# Patient Record
Sex: Male | Born: 1960 | Race: White | Hispanic: No | Marital: Single | Smoking: Former smoker
Health system: Southern US, Community
[De-identification: ages and names within clinical notes are randomized; demographics above are authoritative.]

## PROBLEM LIST (undated history)

## (undated) DIAGNOSIS — G40909 Epilepsy, unspecified, not intractable, without status epilepticus: Secondary | ICD-10-CM

## (undated) HISTORY — DX: Epilepsy, unspecified, not intractable, without status epilepticus: G40.909

## (undated) HISTORY — PX: HYDROCELE EXCISION: SHX482

---

## 2004-02-28 ENCOUNTER — Other Ambulatory Visit: Payer: Self-pay

## 2004-02-28 ENCOUNTER — Inpatient Hospital Stay: Payer: Self-pay | Admitting: Anesthesiology

## 2004-02-29 ENCOUNTER — Other Ambulatory Visit: Payer: Self-pay

## 2004-03-25 ENCOUNTER — Ambulatory Visit: Payer: Self-pay | Admitting: General Surgery

## 2004-04-26 ENCOUNTER — Ambulatory Visit: Payer: Self-pay | Admitting: Internal Medicine

## 2010-01-30 ENCOUNTER — Emergency Department: Payer: Self-pay | Admitting: Internal Medicine

## 2013-05-14 ENCOUNTER — Emergency Department: Payer: Self-pay | Admitting: Emergency Medicine

## 2013-05-14 LAB — CBC WITH DIFFERENTIAL/PLATELET
Basophil #: 0 10*3/uL (ref 0.0–0.1)
Basophil %: 0.2 %
Eosinophil #: 0.3 10*3/uL (ref 0.0–0.7)
Eosinophil %: 5.2 %
HCT: 40.9 % (ref 40.0–52.0)
HGB: 13.9 g/dL (ref 13.0–18.0)
LYMPHS ABS: 0.9 10*3/uL — AB (ref 1.0–3.6)
Lymphocyte %: 15.3 %
MCH: 30.7 pg (ref 26.0–34.0)
MCHC: 33.9 g/dL (ref 32.0–36.0)
MCV: 91 fL (ref 80–100)
Monocyte #: 0.6 x10 3/mm (ref 0.2–1.0)
Monocyte %: 10.1 %
NEUTROS ABS: 4.2 10*3/uL (ref 1.4–6.5)
Neutrophil %: 69.2 %
PLATELETS: 393 10*3/uL (ref 150–440)
RBC: 4.51 10*6/uL (ref 4.40–5.90)
RDW: 15.9 % — ABNORMAL HIGH (ref 11.5–14.5)
WBC: 6 10*3/uL (ref 3.8–10.6)

## 2013-05-14 LAB — BASIC METABOLIC PANEL
Anion Gap: 8 (ref 7–16)
BUN: 15 mg/dL (ref 7–18)
CHLORIDE: 105 mmol/L (ref 98–107)
CO2: 24 mmol/L (ref 21–32)
Calcium, Total: 8.1 mg/dL — ABNORMAL LOW (ref 8.5–10.1)
Creatinine: 0.96 mg/dL (ref 0.60–1.30)
EGFR (African American): >60
EGFR (Non-African Amer.): >60
GLUCOSE: 115 mg/dL — AB (ref 65–99)
OSMOLALITY: 277 (ref 275–301)
Potassium: 4.3 mmol/L (ref 3.5–5.1)
Sodium: 137 mmol/L (ref 136–145)

## 2016-09-09 DIAGNOSIS — I999 Unspecified disorder of circulatory system: Secondary | ICD-10-CM | POA: Insufficient documentation

## 2017-01-02 ENCOUNTER — Encounter: Payer: Self-pay | Admitting: Emergency Medicine

## 2017-01-02 ENCOUNTER — Emergency Department
Admission: EM | Admit: 2017-01-02 | Discharge: 2017-01-02 | Disposition: A | Discharge status: Home / Self Care | Payer: BLUE CROSS/BLUE SHIELD | Attending: Emergency Medicine | Admitting: Emergency Medicine

## 2017-01-02 ENCOUNTER — Emergency Department: Payer: BLUE CROSS/BLUE SHIELD

## 2017-01-02 DIAGNOSIS — M79604 Pain in right leg: Secondary | ICD-10-CM | POA: Diagnosis present

## 2017-01-02 DIAGNOSIS — L03115 Cellulitis of right lower limb: Secondary | ICD-10-CM | POA: Insufficient documentation

## 2017-01-02 DIAGNOSIS — F17210 Nicotine dependence, cigarettes, uncomplicated: Secondary | ICD-10-CM | POA: Insufficient documentation

## 2017-01-02 LAB — CBC WITH DIFFERENTIAL/PLATELET
BASOS PCT: 1 %
Basophils Absolute: 0 10*3/uL (ref 0–0.1)
EOS ABS: 0.3 10*3/uL (ref 0–0.7)
Eosinophils Relative: 5 %
HCT: 42.2 % (ref 40.0–52.0)
Hemoglobin: 14.3 g/dL (ref 13.0–18.0)
LYMPHS ABS: 0.7 10*3/uL — AB (ref 1.0–3.6)
LYMPHS PCT: 12 %
MCH: 29.7 pg (ref 26.0–34.0)
MCHC: 33.7 g/dL (ref 32.0–36.0)
MCV: 88 fL (ref 80.0–100.0)
MONO ABS: 0.6 10*3/uL (ref 0.2–1.0)
Monocytes Relative: 11 %
NEUTROS ABS: 3.8 10*3/uL (ref 1.4–6.5)
NEUTROS PCT: 71 %
Platelets: 279 10*3/uL (ref 150–440)
RBC: 4.8 MIL/uL (ref 4.40–5.90)
RDW: 14.6 % — AB (ref 11.5–14.5)
WBC: 5.4 10*3/uL (ref 3.8–10.6)

## 2017-01-02 LAB — COMPREHENSIVE METABOLIC PANEL
ALBUMIN: 4.1 g/dL (ref 3.5–5.0)
ALT: 34 U/L (ref 17–63)
ANION GAP: 8 (ref 5–15)
AST: 29 U/L (ref 15–41)
Alkaline Phosphatase: 127 U/L — ABNORMAL HIGH (ref 38–126)
BILIRUBIN TOTAL: 0.7 mg/dL (ref 0.3–1.2)
BUN: 14 mg/dL (ref 6–20)
CO2: 24 mmol/L (ref 22–32)
Calcium: 9.2 mg/dL (ref 8.9–10.3)
Chloride: 102 mmol/L (ref 101–111)
Creatinine, Ser: 0.83 mg/dL (ref 0.61–1.24)
GFR calc non Af Amer: >60 mL/min (ref >60)
GLUCOSE: 94 mg/dL (ref 65–99)
POTASSIUM: 4.1 mmol/L (ref 3.5–5.1)
SODIUM: 134 mmol/L — AB (ref 135–145)
TOTAL PROTEIN: 7.1 g/dL (ref 6.5–8.1)

## 2017-01-02 MED ORDER — SULFAMETHOXAZOLE-TRIMETHOPRIM 800-160 MG PO TABS: 1.0000 | ORAL_TABLET | Freq: Two times a day (BID) | ORAL | 0 refills | Status: AC

## 2017-01-02 MED ORDER — VANCOMYCIN HCL IN DEXTROSE 1-5 GM/200ML-% IV SOLN
1000.0000 mg | Freq: Once | INTRAVENOUS | Status: AC
  Administered 2017-01-02: 1000 mg via INTRAVENOUS
  Filled 2017-01-02: qty 200

## 2017-01-02 NOTE — ED Provider Notes (Signed)
[facility] Emergency Department Provider Note  Time seen: 12:53 PM  I have reviewed the triage vital signs and the nursing notes.   HISTORY  Chief Complaint Leg Swelling    HPI David Lynn is a 56 y.o. male who presents to the emergency department for right leg pain swelling and redness. According to the patient for the past several years he has had intermittent swelling and redness of the right leg. He states over the past one week it has gotten worse along with pain. Denies any fever or nausea or vomiting. Patient was sent by his doctor for an ultrasound to rule out DVT. No history of DVT per patient. Denies any chest pain or trouble breathing.  History reviewed. No pertinent past medical history.  There are no active problems to display for this patient.   Past Surgical History:  Procedure Laterality Date  . HYDROCELE EXCISION      Prior to Admission medications   Not on File    Allergies  Allergen Reactions  . Iodine Rash  . Penicillins Rash    No family history on file.  Social History Social History  Substance Use Topics  . Smoking status: Current Every Day Smoker    Packs/day: 1.00    Types: Cigarettes  . Smokeless tobacco: Not on file  . Alcohol use Not on file    Review of Systems Constitutional: Negative for fever. Cardiovascular: Negative for chest pain. Respiratory: Negative for shortness of breath. Gastrointestinal: Negative for abdominal pain Musculoskeletal: Right leg pain, swelling and redness. Neurological: Negative for headache All other ROS negative  ____________________________________________   PHYSICAL EXAM:  VITAL SIGNS: ED Triage Vitals [01/02/17 1115]  Enc Vitals Group     BP (!) 103/59     Pulse Rate (!) 55     Resp 18     Temp 97.8 F (36.6 C)     Temp Source Oral     SpO2 97 %     Weight 264 lb (119.7 kg)     Height 6' (1.829 m)     Head Circumference      Peak Flow      Pain Score       Pain Loc      Pain Edu?      Excl. in [place]?     Constitutional: Alert and oriented. Well appearing and in no distress. Eyes: Normal exam ENT   Head: Normocephalic and atraumatic.   Mouth/Throat: Mucous membranes are moist. Cardiovascular: Normal rate, regular rhythm. No murmur Respiratory: Normal respiratory effort without tachypnea nor retractions. Breath sounds are clear  Gastrointestinal: Soft and nontender. No distention.  Musculoskeletal: Moderate edema in the right lower extremity with erythema midway up the tibia with tenderness to palpation. Exam most consistent with DVT versus cellulitis. Neurologic:  Normal speech and language. No gross focal neurologic deficits Skin:  Skin is warm, dry. Erythema in the right lower extremity as described above. Psychiatric: Mood and affect are normal.   ____________________________________________   RADIOLOGY  Ultrasound negative for DVT  ____________________________________________   INITIAL IMPRESSION / ASSESSMENT AND PLAN / ED COURSE  Pertinent labs & imaging results that were available during my care of the patient were reviewed by me and considered in my medical decision making (see chart for details).  Patient presents the emergency department for right lower extremity pain, swelling and redness. No chest pain or shortness of breath. Differential this time would include cellulitis versus lymphedema vs DVT.  Ultrasound  is negative for DVT. Highly suspect cellulitis. We will check labs, we will begin IV vancomycin infusion. We will await lab results to decide upon disposition and outpatient antibiotics.  Patient's labs are reassuring, white blood cell count is normal. We will place the patient on Bactrim twice daily for the next 10 days and have him follow-up with his primary care doctor in 2-3 days for recheck. The area of cellulitis has been outlined with a skin marker. Discussed return precautions with the  patient.  ____________________________________________   FINAL CLINICAL IMPRESSION(S) / ED DIAGNOSES  Right lower extremity cellulitis    [place] Dark, MD 01/02/17 1436

## 2017-01-02 NOTE — ED Triage Notes (Signed)
R leg edema x several days. Not painful at rest but painful when he stands on it. Sent by clinic for R/O DVT.

## 2017-01-16 ENCOUNTER — Telehealth: Payer: Self-pay | Admitting: Emergency Medicine

## 2017-01-16 NOTE — Telephone Encounter (Signed)
Called patient to ask if he has pcp.  Left message indicating that I have information from [facility], but that it would be better for his pcp to have the information. Left my number.

## 2017-03-06 ENCOUNTER — Encounter (INDEPENDENT_AMBULATORY_CARE_PROVIDER_SITE_OTHER): Payer: Self-pay

## 2017-03-06 ENCOUNTER — Ambulatory Visit (INDEPENDENT_AMBULATORY_CARE_PROVIDER_SITE_OTHER): Payer: BLUE CROSS/BLUE SHIELD

## 2017-03-06 ENCOUNTER — Encounter (INDEPENDENT_AMBULATORY_CARE_PROVIDER_SITE_OTHER): Payer: Self-pay | Admitting: Vascular Surgery

## 2017-03-06 ENCOUNTER — Ambulatory Visit (INDEPENDENT_AMBULATORY_CARE_PROVIDER_SITE_OTHER): Payer: BLUE CROSS/BLUE SHIELD | Admitting: Vascular Surgery

## 2017-03-06 ENCOUNTER — Other Ambulatory Visit (INDEPENDENT_AMBULATORY_CARE_PROVIDER_SITE_OTHER): Payer: Self-pay | Admitting: Vascular Surgery

## 2017-03-06 ENCOUNTER — Other Ambulatory Visit (INDEPENDENT_AMBULATORY_CARE_PROVIDER_SITE_OTHER): Payer: Self-pay | Admitting: *Deleted

## 2017-03-06 DIAGNOSIS — L03119 Cellulitis of unspecified part of limb: Secondary | ICD-10-CM | POA: Diagnosis not present

## 2017-03-06 DIAGNOSIS — I83813 Varicose veins of bilateral lower extremities with pain: Secondary | ICD-10-CM | POA: Diagnosis not present

## 2017-03-06 DIAGNOSIS — I83013 Varicose veins of right lower extremity with ulcer of ankle: Secondary | ICD-10-CM

## 2017-03-06 DIAGNOSIS — L97919 Non-pressure chronic ulcer of unspecified part of right lower leg with unspecified severity: Secondary | ICD-10-CM

## 2017-03-06 DIAGNOSIS — I872 Venous insufficiency (chronic) (peripheral): Secondary | ICD-10-CM | POA: Diagnosis not present

## 2017-03-06 DIAGNOSIS — L97319 Non-pressure chronic ulcer of right ankle with unspecified severity: Secondary | ICD-10-CM | POA: Diagnosis not present

## 2017-03-07 ENCOUNTER — Encounter (INDEPENDENT_AMBULATORY_CARE_PROVIDER_SITE_OTHER): Payer: Self-pay | Admitting: Vascular Surgery

## 2017-03-07 DIAGNOSIS — I872 Venous insufficiency (chronic) (peripheral): Secondary | ICD-10-CM | POA: Insufficient documentation

## 2017-03-07 DIAGNOSIS — I83813 Varicose veins of bilateral lower extremities with pain: Secondary | ICD-10-CM | POA: Insufficient documentation

## 2017-03-07 DIAGNOSIS — L97319 Non-pressure chronic ulcer of right ankle with unspecified severity: Principal | ICD-10-CM

## 2017-03-07 DIAGNOSIS — I83013 Varicose veins of right lower extremity with ulcer of ankle: Secondary | ICD-10-CM | POA: Insufficient documentation

## 2017-03-07 NOTE — Progress Notes (Signed)
MRN : [id number]  David Lynn is a 56 y.o. (Dec 08, 1960) male who presents with chief complaint of  Chief Complaint  Patient presents with  . New Patient (Initial Visit)    Ble art ultrasound  .  History of Present Illness: Patient is seen for evaluation of leg pain and swelling associated with onset right ankle ulceration. The patient first noticed swelling remotely. The swelling is associated with pain and discoloration. The pain and swelling worsens with prolonged dependency and improves with elevation. The pain is unrelated to activity.  The patient notes that in the morning the legs are better but the leg symptoms worsened throughout the course of the day. The patient has also noted a progressive worsening of the discoloration in the ankle and shin area.   The patient notes that an ulcer has developed acutely without specific trauma and since it occurred it has been very slow to heal.  There is a moderate amount of drainage associated with the open area.  The wound is also very painful.  The patient denies claudication symptoms or rest pain symptoms.  The patient denies DJD and LS spine disease.  The patient has not had any past angiography, interventions or vascular surgery.  Elevation makes the leg symptoms better, dependency makes them much worse. The patient denies any recent changes in medications.  The patient has not been wearing graduated compression.  The patient denies a history of DVT or PE. There is no prior history of phlebitis. There is no history of primary lymphedema.  No history of malignancies. No history of trauma or groin or pelvic surgery. There is no history of radiation treatment to the groin or pelvis      Current Meds  Medication Sig  . lamoTRIgine (LAMICTAL) 200 MG tablet Take by mouth.  . mupirocin ointment (BACTROBAN) 2 % Place 1 application into the nose as needed.    Past Medical History:  Diagnosis Date  . Seizure disorder [facility])      Past Surgical History:  Procedure Laterality Date  . HYDROCELE EXCISION      Social History Social History   Tobacco Use  . Smoking status: Current Every Day Smoker    Packs/day: 1.00    Types: Cigarettes  . Smokeless tobacco: Never Used  Substance Use Topics  . Alcohol use: No    Frequency: Never  . Drug use: No    Family History Family History  Problem Relation Age of Onset  . Heart attack Father   No family history of bleeding/clotting disorders, porphyria or autoimmune disease   Allergies  Allergen Reactions  . Iodine Rash  . Penicillins Rash     REVIEW OF SYSTEMS (Negative unless checked)  Constitutional: [] Weight loss  [] Fever  [] Chills Cardiac: [] Chest pain   [] Chest pressure   [] Palpitations   [] Shortness of breath when laying flat   [] Shortness of breath with exertion. Vascular:  [] Pain in legs with walking   [x] Pain in legs with standing  [] History of DVT   [] Phlebitis   [x] Swelling in legs   [x] Varicose veins   [x] Non-healing ulcers Pulmonary:   [] Uses home oxygen   [] Productive cough   [] Hemoptysis   [] Wheeze  [] COPD   [] Asthma Neurologic:  [] Dizziness   [] Seizures   [] History of stroke   [] History of TIA  [] Aphasia   [] Vissual changes   [] Weakness or numbness in arm   [] Weakness or numbness in leg Musculoskeletal:   [] Joint swelling   [] Joint pain   []   Low back pain Hematologic:  [] Easy bruising  [] Easy bleeding   [] Hypercoagulable state   [] Anemic Gastrointestinal:  [] Diarrhea   [] Vomiting  [] Gastroesophageal reflux/heartburn   [] Difficulty swallowing. Genitourinary:  [] Chronic kidney disease   [] Difficult urination  [] Frequent urination   [] Blood in urine Skin:  [] Rashes   [] Ulcers  Psychological:  [] History of anxiety   []  History of major depression.  Physical Examination  Vitals:   03/06/17 1413  BP: 118/68  Pulse: 62  Resp: 17  Weight: 109.3 kg (241 lb)  Height: 6' (1.829 m)   Body mass index is 32.69 kg/m. Gen: WD/WN, NAD Head:  /AT, No temporalis wasting.  Ear/Nose/Throat: Hearing grossly intact, nares w/o erythema or drainage Eyes: PER, EOMI, sclera nonicteric.  Neck: Supple, no large masses.   Pulmonary:  Good air movement, no audible wheezing bilaterally, no use of accessory muscles.  Cardiac: RRR, no JVD Vascular:Large varicosities present extensively greater than 10 mm .  severe venous stasis changes to the legs bilaterally.  4+ soft pitting edema Vessel Right Left  Radial Palpable Palpable  PT Palpable Palpable  DP Palpable Palpable  Gastrointestinal: Non-distended. No guarding/no peritoneal signs.  Musculoskeletal: M/S 5/5 throughout.  No deformity or atrophy.  Neurologic: CN 2-12 intact. Symmetrical.  Speech is fluent. Motor exam as listed above. Psychiatric: Judgment intact, Mood & affect appropriate for pt's clinical situation. Dermatologic: severe rashes with right ulcers noted.  No changes consistent with cellulitis. Lymph : No lichenification or skin changes of chronic lymphedema.  CBC Lab Results  Component Value Date   WBC 5.4 01/02/2017   HGB 14.3 01/02/2017   HCT 42.2 01/02/2017   MCV 88.0 01/02/2017   PLT 279 01/02/2017    BMET    Component Value Date/Time   NA 134 (L) 01/02/2017 1309   NA 137 05/14/2013 1737   K 4.1 01/02/2017 1309   K 4.3 05/14/2013 1737   CL 102 01/02/2017 1309   CL 105 05/14/2013 1737   CO2 24 01/02/2017 1309   CO2 24 05/14/2013 1737   GLUCOSE 94 01/02/2017 1309   GLUCOSE 115 (H) 05/14/2013 1737   BUN 14 01/02/2017 1309   BUN 15 05/14/2013 1737   CREATININE 0.83 01/02/2017 1309   CREATININE 0.96 05/14/2013 1737   CALCIUM 9.2 01/02/2017 1309   CALCIUM 8.1 (L) 05/14/2013 1737   GFRNONAA >60 01/02/2017 1309   GFRNONAA >60 05/14/2013 1737   GFRAA >60 01/02/2017 1309   GFRAA >60 05/14/2013 1737   CrCl cannot be calculated (Patient's most recent lab result is older than the maximum 21 days allowed.).  COAG No results found for: INR,  PROTIME  Radiology No results found.  Assessment/Plan 1. Venous ulcer of ankle, right (HCC) No surgery or intervention at this point in time.    I have had a long discussion with the patient regarding venous insufficiency and why it  causes symptoms, specifically venous ulceration . I have discussed with the patient the chronic skin changes that accompany venous insufficiency and the long term sequela such as infection and recurring  ulceration.  Patient will use a local dressing and firm compression which will be changed daily drainage permitting.  In addition, behavioral modification including several periods of elevation of the lower extremities during the day will be continued. Achieving a position with the ankles at heart level was stressed to the patient  The patient is instructed to begin routine exercise, especially walking on a daily basis  Patient's duplex ultrasound of the venous system  is negative for DVT but reflux is present throughout the deep and the superficial system of the right leg.  The patient will follow up in 6 weeks to reassess the degree of swelling and the control that compression therapy is offering.   The patient can be assessed for graduated compression wraps as well as a Lymph Pump at that time.   A total of 70 minutes was spent with this patient and greater than 50% was spent in counseling and coordination of care with the patient.  Discussion included the treatment options for vascular disease including indications for surgery and intervention.  Also discussed is the appropriate timing of treatment.  In addition medical therapy was discussed.  2. Chronic venous insufficiency See #1  3. Varicose veins of both lower extremities with pain Compression for now    Hortencia Pilar, MD  03/07/2017 9:23 PM

## 2017-03-20 ENCOUNTER — Encounter (INDEPENDENT_AMBULATORY_CARE_PROVIDER_SITE_OTHER): Payer: Self-pay | Admitting: Vascular Surgery

## 2017-03-20 ENCOUNTER — Encounter (INDEPENDENT_AMBULATORY_CARE_PROVIDER_SITE_OTHER): Payer: Self-pay

## 2017-04-16 NOTE — Progress Notes (Signed)
MRN : [id number]  David Lynn is a 57 y.o. (28-Apr-1960) male who presents with chief complaint of No chief complaint on file. Marland Kitchen  History of Present Illness: Patient is seen for follow up evaluation of leg pain and swelling associated with venous ulceration. The patient was recently seen here and started on Unna boot therapy.  The swelling abruptly became much worse bilaterally and is associated with pain and discoloration. The pain and swelling worsens with prolonged dependency and improves with elevation.  The patient notes that in the morning the legs are better but the leg symptoms worsened throughout the course of the day. The patient has also noted a progressive worsening of the discoloration in the ankle and shin area.   The patient notes that an ulcer has developed acutely without specific trauma and since it occurred it has been very slow to heal.  There is a moderate amount of drainage associated with the open area.  The wound is also very painful.  The patient notes that they were not able to tolerate the Unna boot and removed it several days ago.  The patient states that they have been elevating as much as possible. The patient denies any recent changes in medications.  The patient denies a history of DVT or PE. There is no prior history of phlebitis. There is no history of primary lymphedema.  No SOB or increased cough.  No sputum production.  No recent episodes of CHF exacerbation.   No outpatient medications have been marked as taking for the 04/17/17 encounter (Appointment) with Delana Meyer, Dolores Lory, MD.    Past Medical History:  Diagnosis Date  . Seizure disorder [facility])     Past Surgical History:  Procedure Laterality Date  . HYDROCELE EXCISION      Social History Social History   Tobacco Use  . Smoking status: Current Every Day Smoker    Packs/day: 1.00    Types: Cigarettes  . Smokeless tobacco: Never Used  Substance Use Topics  . Alcohol use: No   Frequency: Never  . Drug use: No    Family History Family History  Problem Relation Age of Onset  . Heart attack Father     Allergies  Allergen Reactions  . Iodine Rash  . Penicillins Rash     REVIEW OF SYSTEMS (Negative unless checked)  Constitutional: [] Weight loss  [] Fever  [] Chills Cardiac: [] Chest pain   [] Chest pressure   [] Palpitations   [] Shortness of breath when laying flat   [] Shortness of breath with exertion. Vascular:  [] Pain in legs with walking   [x] Pain in legs at rest  [] History of DVT   [] Phlebitis   [x] Swelling in legs   [x] Varicose veins   [x] Non-healing ulcers Pulmonary:   [] Uses home oxygen   [] Productive cough   [] Hemoptysis   [] Wheeze  [] COPD   [] Asthma Neurologic:  [] Dizziness   [] Seizures   [] History of stroke   [] History of TIA  [] Aphasia   [] Vissual changes   [] Weakness or numbness in arm   [] Weakness or numbness in leg Musculoskeletal:   [] Joint swelling   [] Joint pain   [] Low back pain Hematologic:  [] Easy bruising  [] Easy bleeding   [] Hypercoagulable state   [] Anemic Gastrointestinal:  [] Diarrhea   [] Vomiting  [] Gastroesophageal reflux/heartburn   [] Difficulty swallowing. Genitourinary:  [] Chronic kidney disease   [] Difficult urination  [] Frequent urination   [] Blood in urine Skin:  [x] Rashes   [x] Ulcers  Psychological:  [] History of anxiety   []  History  of major depression.  Physical Examination  There were no vitals filed for this visit. There is no height or weight on file to calculate BMI. Gen: WD/WN, NAD Head: [place]/AT, No temporalis wasting.  Ear/Nose/Throat: Hearing grossly intact, nares w/o erythema or drainage Eyes: PER, EOMI, sclera nonicteric.  Neck: Supple, no large masses.   Pulmonary:  Good air movement, no audible wheezing bilaterally, no use of accessory muscles.  Cardiac: RRR, no JVD Vascular: 2-3+ edema of the right leg with severe venous changes of the right leg.  Venous ulcer noted in the ankle area on the right,  noninfected Vessel Right Left  Radial Palpable Palpable  PT Palpable Palpable  DP Palpable Palpable  Gastrointestinal: Non-distended. No guarding/no peritoneal signs.  Musculoskeletal: M/S 5/5 throughout.  No deformity or atrophy.  Neurologic: CN 2-12 intact. Symmetrical.  Speech is fluent. Motor exam as listed above. Psychiatric: Judgment intact, Mood & affect appropriate for pt's clinical situation. Dermatologic: Venous stasis dermatitis with healed ulcer of the right ankle.  No changes consistent with cellulitis.  Lymph : No lichenification or skin changes of chronic lymphedema.  CBC Lab Results  Component Value Date   WBC 5.4 01/02/2017   HGB 14.3 01/02/2017   HCT 42.2 01/02/2017   MCV 88.0 01/02/2017   PLT 279 01/02/2017    BMET    Component Value Date/Time   NA 134 (L) 01/02/2017 1309   NA 137 05/14/2013 1737   K 4.1 01/02/2017 1309   K 4.3 05/14/2013 1737   CL 102 01/02/2017 1309   CL 105 05/14/2013 1737   CO2 24 01/02/2017 1309   CO2 24 05/14/2013 1737   GLUCOSE 94 01/02/2017 1309   GLUCOSE 115 (H) 05/14/2013 1737   BUN 14 01/02/2017 1309   BUN 15 05/14/2013 1737   CREATININE 0.83 01/02/2017 1309   CREATININE 0.96 05/14/2013 1737   CALCIUM 9.2 01/02/2017 1309   CALCIUM 8.1 (L) 05/14/2013 1737   GFRNONAA >60 01/02/2017 1309   GFRNONAA >60 05/14/2013 1737   GFRAA >60 01/02/2017 1309   GFRAA >60 05/14/2013 1737   CrCl cannot be calculated (Patient's most recent lab result is older than the maximum 21 days allowed.).  COAG No results found for: INR, PROTIME  Radiology No results found.  Assessment/Plan 1. Chronic venous insufficiency No surgery or intervention at this point in time.    I have had a long discussion with the patient regarding venous insufficiency and why it  causes symptoms. I have discussed with the patient the chronic skin changes that accompany venous insufficiency and the long term sequela such as infection and ulceration.  Patient will  begin wearing graduated compression stockings class 1 (20-30 mmHg) or compression wraps on a daily basis a prescription was given. The patient will put the stockings on first thing in the morning and removing them in the evening. The patient is instructed specifically not to sleep in the stockings.    In addition, behavioral modification including several periods of elevation of the lower extremities during the day will be continued. I have demonstrated that proper elevation is a position with the ankles at heart level.  The patient is instructed to begin routine exercise, especially walking on a daily basis  Patient's duplex ultrasound of the venous system is negative for DVT but shows reflux in the deep and superficial system.  I have discussed laser ablation of the GSV but he does not wich to pursue this at this time.  The patient will follow up  in 6 months to reassess the degree of swelling and the control that graduated compression stockings or compression wraps  is offering.   The patient can be assessed for a Lymph Pump at that time  2. Varicose veins of both lower extremities with pain See #1  3. Venous ulcer of ankle, right [facility] Now healed    Hortencia Pilar, MD  04/16/2017 12:57 PM

## 2017-04-17 ENCOUNTER — Encounter (INDEPENDENT_AMBULATORY_CARE_PROVIDER_SITE_OTHER): Payer: Self-pay | Admitting: Vascular Surgery

## 2017-04-17 ENCOUNTER — Ambulatory Visit (INDEPENDENT_AMBULATORY_CARE_PROVIDER_SITE_OTHER): Payer: BLUE CROSS/BLUE SHIELD | Admitting: Vascular Surgery

## 2017-04-17 VITALS — BP 106/61 | HR 58 | Resp 17 | Wt 238.0 lb

## 2017-04-17 DIAGNOSIS — I872 Venous insufficiency (chronic) (peripheral): Secondary | ICD-10-CM

## 2017-04-17 DIAGNOSIS — I83013 Varicose veins of right lower extremity with ulcer of ankle: Secondary | ICD-10-CM

## 2017-04-17 DIAGNOSIS — L97319 Non-pressure chronic ulcer of right ankle with unspecified severity: Secondary | ICD-10-CM | POA: Diagnosis not present

## 2017-04-17 DIAGNOSIS — I83813 Varicose veins of bilateral lower extremities with pain: Secondary | ICD-10-CM

## 2017-07-26 ENCOUNTER — Telehealth: Payer: Self-pay | Admitting: Family Medicine

## 2017-07-26 NOTE — Telephone Encounter (Signed)
I may have discussed this previously though at this time I am no longer taking any more new patients.

## 2017-07-26 NOTE — Telephone Encounter (Signed)
Does this sound familiar to you?

## 2017-07-26 NOTE — Telephone Encounter (Signed)
Copied from [place] [id number]. Topic: Appointment Scheduling - New Patient >> Jul 26, 2017 11:09 AM Oliver Pila B wrote: Pt's mother called and states on her last visit Dr. Caryl Bis said she will take her son as a new pt, per one note this crm shouldn't be sent but contact pt or pt's [phone]) w/ decision

## 2017-07-27 NOTE — Telephone Encounter (Signed)
Patient mother notified that Dr. Caryl Bis will not be accepting new patients at this time.

## 2017-07-27 NOTE — Telephone Encounter (Signed)
Called and left voicemail for patient mother to call the office.

## 2017-10-16 ENCOUNTER — Ambulatory Visit (INDEPENDENT_AMBULATORY_CARE_PROVIDER_SITE_OTHER): Payer: BLUE CROSS/BLUE SHIELD | Admitting: Vascular Surgery

## 2017-10-23 ENCOUNTER — Ambulatory Visit (INDEPENDENT_AMBULATORY_CARE_PROVIDER_SITE_OTHER): Payer: BLUE CROSS/BLUE SHIELD | Admitting: Vascular Surgery

## 2017-10-23 ENCOUNTER — Encounter (INDEPENDENT_AMBULATORY_CARE_PROVIDER_SITE_OTHER): Payer: Self-pay | Admitting: Vascular Surgery

## 2017-10-23 VITALS — BP 103/63 | HR 51 | Resp 15 | Ht 70.0 in | Wt 244.0 lb

## 2017-10-23 DIAGNOSIS — I83813 Varicose veins of bilateral lower extremities with pain: Secondary | ICD-10-CM | POA: Diagnosis not present

## 2017-10-23 DIAGNOSIS — I872 Venous insufficiency (chronic) (peripheral): Secondary | ICD-10-CM

## 2017-10-23 NOTE — Progress Notes (Signed)
MRN : [id number]  David Lynn is a 57 y.o. (04/23/1960) male who presents with chief complaint of  Chief Complaint  Patient presents with  . Follow-up    6 month no studies  .  History of Present Illness:The patient returns to the office for followup evaluation regarding venous changes associated with leg swelling.  The swelling has improved quite a bit and the pain associated with swelling has decreased substantially with conservative measures. There have not been any interval development of a ulcerations or wounds.  Since the previous visit the patient has been wearing graduated compression stockings and has noted little significant improvement in the lymphedema. The patient has been using compression routinely morning until night.  The patient also states elevation during the day and exercise is being done too.     No outpatient medications have been marked as taking for the 10/23/17 encounter (Office Visit) with Delana Meyer, Dolores Lory, MD.    Past Medical History:  Diagnosis Date  . Seizure disorder [facility])     Past Surgical History:  Procedure Laterality Date  . HYDROCELE EXCISION      Social History Social History   Tobacco Use  . Smoking status: Current Every Day Smoker    Packs/day: 1.00    Types: Cigarettes  . Smokeless tobacco: Never Used  Substance Use Topics  . Alcohol use: No    Frequency: Never  . Drug use: No    Family History Family History  Problem Relation Age of Onset  . Heart attack Father     Allergies  Allergen Reactions  . Iodine Rash  . Penicillins Rash     REVIEW OF SYSTEMS (Negative unless checked)  Constitutional: [] Weight loss  [] Fever  [] Chills Cardiac: [] Chest pain   [] Chest pressure   [] Palpitations   [] Shortness of breath when laying flat   [] Shortness of breath with exertion. Vascular:  [] Pain in legs with walking   [x] Pain in legs dependency [] History of DVT   [] Phlebitis   [x] Swelling in legs   [x] Varicose veins    [] Non-healing ulcers Pulmonary:   [] Uses home oxygen   [] Productive cough   [] Hemoptysis   [] Wheeze  [] COPD   [] Asthma Neurologic:  [] Dizziness   [] Seizures   [] History of stroke   [] History of TIA  [] Aphasia   [] Vissual changes   [] Weakness or numbness in arm   [] Weakness or numbness in leg Musculoskeletal:   [] Joint swelling   [] Joint pain   [] Low back pain Hematologic:  [] Easy bruising  [] Easy bleeding   [] Hypercoagulable state   [] Anemic Gastrointestinal:  [] Diarrhea   [] Vomiting  [] Gastroesophageal reflux/heartburn   [] Difficulty swallowing. Genitourinary:  [] Chronic kidney disease   [] Difficult urination  [] Frequent urination   [] Blood in urine Skin:  [] Rashes   [] Ulcers  Psychological:  [] History of anxiety   []  History of major depression.  Physical Examination  Vitals:   10/23/17 1051  BP: 103/63  Pulse: (!) 51  Resp: 15  Weight: 244 lb (110.7 kg)  Height: 5\' 10"  (1.778 m)   Body mass index is 35.01 kg/m. Gen: WD/WN, NAD Head: [place]/AT, No temporalis wasting.  Ear/Nose/Throat: Hearing grossly intact, nares w/o erythema or drainage Eyes: PER, EOMI, sclera nonicteric.  Neck: Supple, no large masses.   Pulmonary:  Good air movement, no audible wheezing bilaterally, no use of accessory muscles.  Cardiac: RRR, no JVD Vascular: scattered varicosities present bilaterally.  Mild venous stasis changes to the legs bilaterally.  2+ soft pitting edema Vessel  Right Left  Radial Palpable Palpable  PT Palpable Palpable  DP Palpable Palpable  Gastrointestinal: Non-distended. No guarding/no peritoneal signs.  Musculoskeletal: M/S 5/5 throughout.  No deformity or atrophy.  Neurologic: CN 2-12 intact. Symmetrical.  Speech is fluent. Motor exam as listed above. Psychiatric: Judgment intact, Mood & affect appropriate for pt's clinical situation. Dermatologic: venous rashes no ulcers noted.  No changes consistent with cellulitis. Lymph : No lichenification or skin changes of chronic  lymphedema.  CBC Lab Results  Component Value Date   WBC 5.4 01/02/2017   HGB 14.3 01/02/2017   HCT 42.2 01/02/2017   MCV 88.0 01/02/2017   PLT 279 01/02/2017    BMET    Component Value Date/Time   NA 134 (L) 01/02/2017 1309   NA 137 05/14/2013 1737   K 4.1 01/02/2017 1309   K 4.3 05/14/2013 1737   CL 102 01/02/2017 1309   CL 105 05/14/2013 1737   CO2 24 01/02/2017 1309   CO2 24 05/14/2013 1737   GLUCOSE 94 01/02/2017 1309   GLUCOSE 115 (H) 05/14/2013 1737   BUN 14 01/02/2017 1309   BUN 15 05/14/2013 1737   CREATININE 0.83 01/02/2017 1309   CREATININE 0.96 05/14/2013 1737   CALCIUM 9.2 01/02/2017 1309   CALCIUM 8.1 (L) 05/14/2013 1737   GFRNONAA >60 01/02/2017 1309   GFRNONAA >60 05/14/2013 1737   GFRAA >60 01/02/2017 1309   GFRAA >60 05/14/2013 1737   CrCl cannot be calculated (Patient's most recent lab result is older than the maximum 21 days allowed.).  COAG No results found for: INR, PROTIME  Radiology No results found.    Assessment/Plan 1. Varicose veins of both lower extremities with pain  No surgery or intervention at this point in time.    I have reviewed my previous discussion with the patient regarding swelling and why it  causes symptoms.  The patient is doing well with compression and will continue wearing graduated compression stockings class 1 (20-30 mmHg) on a daily basis a prescription was given. The patient will  continue wearing the stockings first thing in the morning and removing them in the evening. The patient is instructed specifically not to sleep in the stockings.    In addition, behavioral modification including elevation during the day and exercise will be continued.    Patient should follow-up on an annual basis   2. Chronic venous insufficiency  No surgery or intervention at this point in time.    I have reviewed my previous discussion with the patient regarding swelling and why it  causes symptoms.  The patient is doing well  with compression and will continue wearing graduated compression stockings class 1 (20-30 mmHg) on a daily basis a prescription was given. The patient will  continue wearing the stockings first thing in the morning and removing them in the evening. The patient is instructed specifically not to sleep in the stockings.    In addition, behavioral modification including elevation during the day and exercise will be continued.    Patient should follow-up on an annual basis     Hortencia Pilar, MD  10/23/2017 11:11 AM

## 2017-10-25 ENCOUNTER — Encounter (INDEPENDENT_AMBULATORY_CARE_PROVIDER_SITE_OTHER): Payer: Self-pay | Admitting: Vascular Surgery

## 2017-11-02 ENCOUNTER — Telehealth (INDEPENDENT_AMBULATORY_CARE_PROVIDER_SITE_OTHER): Payer: Self-pay

## 2017-11-02 NOTE — Telephone Encounter (Signed)
Patient's Mom called and stated that he is ready to move forward with you getting the Lymph pump ordered for his legs. She states that you informed her to call back once they had decided to move forward and checked on their insurance.

## 2017-11-07 ENCOUNTER — Telehealth (INDEPENDENT_AMBULATORY_CARE_PROVIDER_SITE_OTHER): Payer: Self-pay | Admitting: Vascular Surgery

## 2017-11-07 NOTE — Telephone Encounter (Signed)
Patient said they are still waiting to her about the pump and would like to know if it has been approved by [employer].

## 2017-11-20 ENCOUNTER — Ambulatory Visit (INDEPENDENT_AMBULATORY_CARE_PROVIDER_SITE_OTHER): Payer: BLUE CROSS/BLUE SHIELD | Admitting: Vascular Surgery

## 2017-11-20 ENCOUNTER — Encounter (INDEPENDENT_AMBULATORY_CARE_PROVIDER_SITE_OTHER): Payer: Self-pay | Admitting: Vascular Surgery

## 2017-11-20 VITALS — BP 109/63 | HR 68 | Resp 17 | Ht 72.0 in | Wt 240.0 lb

## 2017-11-20 DIAGNOSIS — L97319 Non-pressure chronic ulcer of right ankle with unspecified severity: Secondary | ICD-10-CM

## 2017-11-20 DIAGNOSIS — I872 Venous insufficiency (chronic) (peripheral): Secondary | ICD-10-CM | POA: Diagnosis not present

## 2017-11-20 DIAGNOSIS — I83013 Varicose veins of right lower extremity with ulcer of ankle: Secondary | ICD-10-CM | POA: Diagnosis not present

## 2017-11-20 DIAGNOSIS — I890 Lymphedema, not elsewhere classified: Secondary | ICD-10-CM

## 2017-11-20 NOTE — Progress Notes (Signed)
MRN : [id number]  David Lynn is a 57 y.o. ([birth year]) male who presents with chief complaint of No chief complaint on file. Marland Kitchen  History of Present Illness:  The patient returns to the office for followup evaluation regarding venous changes associated with leg swelling.  The swelling has improved quite a bit and the pain associated with swelling has decreased substantially with conservative measures. There have not been any interval development of a ulcerations or wounds.  Since the previous visit the patient has been wearing graduated compression stockings and has noted little significant improvement in the lymphedema. The patient has been using compression routinely morning until night.  The patient also states elevation during the day and exercise is being done too.   No outpatient medications have been marked as taking for the 11/20/17 encounter (Appointment) with Delana Meyer, Dolores Lory, MD.    Past Medical History:  Diagnosis Date  . Seizure disorder [facility])     Past Surgical History:  Procedure Laterality Date  . HYDROCELE EXCISION      Social History Social History   Tobacco Use  . Smoking status: Current Every Day Smoker    Packs/day: 1.00    Types: Cigarettes  . Smokeless tobacco: Never Used  Substance Use Topics  . Alcohol use: No    Frequency: Never  . Drug use: No    Family History Family History  Problem Relation Age of Onset  . Heart attack Father     Allergies  Allergen Reactions  . Iodine Rash  . Penicillins Rash     REVIEW OF SYSTEMS (Negative unless checked)  Constitutional: [] Weight loss  [] Fever  [] Chills Cardiac: [] Chest pain   [] Chest pressure   [] Palpitations   [] Shortness of breath when laying flat   [] Shortness of breath with exertion. Vascular:  [] Pain in legs with walking   [] Pain in legs at rest  [] History of DVT   [] Phlebitis   [x] Swelling in legs   [] Varicose veins   [] Non-healing ulcers Pulmonary:   [] Uses home oxygen    [] Productive cough   [] Hemoptysis   [] Wheeze  [] COPD   [] Asthma Neurologic:  [] Dizziness   [] Seizures   [] History of stroke   [] History of TIA  [] Aphasia   [] Vissual changes   [] Weakness or numbness in arm   [] Weakness or numbness in leg Musculoskeletal:   [] Joint swelling   [] Joint pain   [] Low back pain Hematologic:  [] Easy bruising  [] Easy bleeding   [] Hypercoagulable state   [] Anemic Gastrointestinal:  [] Diarrhea   [] Vomiting  [] Gastroesophageal reflux/heartburn   [] Difficulty swallowing. Genitourinary:  [] Chronic kidney disease   [] Difficult urination  [] Frequent urination   [] Blood in urine Skin:  [] Rashes   [] Ulcers  Psychological:  [] History of anxiety   []  History of major depression.  Physical Examination  There were no vitals filed for this visit. There is no height or weight on file to calculate BMI. Gen: WD/WN, NAD Head: [place]/AT, No temporalis wasting.  Ear/Nose/Throat: Hearing grossly intact, nares w/o erythema or drainage Eyes: PER, EOMI, sclera nonicteric.  Neck: Supple, no large masses.   Pulmonary:  Good air movement, no audible wheezing bilaterally, no use of accessory muscles.  Cardiac: RRR, no JVD Vascular: scattered varicosities present bilaterally.  Mild venous stasis changes to the legs bilaterally.  3+ soft pitting edema Vessel Right Left  Radial Palpable Palpable  PT Palpable Palpable  DP Palpable Palpable  Gastrointestinal: Non-distended. No guarding/no peritoneal signs.  Musculoskeletal: M/S 5/5 throughout.  No  deformity or atrophy.  Neurologic: CN 2-12 intact. Symmetrical.  Speech is fluent. Motor exam as listed above. Psychiatric: Judgment intact, Mood & affect appropriate for pt's clinical situation. Dermatologic: venous rashes no ulcers noted.  No changes consistent with cellulitis. Lymph : No lichenification or skin changes of chronic lymphedema.  CBC Lab Results  Component Value Date   WBC 5.4 01/02/2017   HGB 14.3 01/02/2017   HCT 42.2 01/02/2017     MCV 88.0 01/02/2017   PLT 279 01/02/2017    BMET    Component Value Date/Time   NA 134 (L) 01/02/2017 1309   NA 137 05/14/2013 1737   K 4.1 01/02/2017 1309   K 4.3 05/14/2013 1737   CL 102 01/02/2017 1309   CL 105 05/14/2013 1737   CO2 24 01/02/2017 1309   CO2 24 05/14/2013 1737   GLUCOSE 94 01/02/2017 1309   GLUCOSE 115 (H) 05/14/2013 1737   BUN 14 01/02/2017 1309   BUN 15 05/14/2013 1737   CREATININE 0.83 01/02/2017 1309   CREATININE 0.96 05/14/2013 1737   CALCIUM 9.2 01/02/2017 1309   CALCIUM 8.1 (L) 05/14/2013 1737   GFRNONAA >60 01/02/2017 1309   GFRNONAA >60 05/14/2013 1737   GFRAA >60 01/02/2017 1309   GFRAA >60 05/14/2013 1737   CrCl cannot be calculated (Patient's most recent lab result is older than the maximum 21 days allowed.).  COAG No results found for: INR, PROTIME  Radiology No results found.  Assessment/Plan 1. Chronic venous insufficiency Recommend:  No surgery or intervention at this point in time.    I have reviewed my previous discussion with the patient regarding swelling and why it causes symptoms.  Patient will continue wearing graduated compression stockings class 1 (20-30 mmHg) on a daily basis. The patient will  beginning wearing the stockings first thing in the morning and removing them in the evening. The patient is instructed specifically not to sleep in the stockings.    In addition, behavioral modification including several periods of elevation of the lower extremities during the day will be continued.  This was reviewed with the patient during the initial visit.  The patient will also continue routine exercise, especially walking on a daily basis as was discussed during the initial visit.    Despite conservative treatments including graduated compression therapy class 1 and behavioral modification including exercise and elevation the patient  has not obtained adequate control of the lymphedema.  The patient still has stage 3  lymphedema and therefore, I believe that a lymph pump should be added to improve the control of the patient's lymphedema.  Additionally, a lymph pump is warranted because it will reduce the risk of cellulitis and ulceration in the future.  Patient should follow-up in six months    2. Lymphedema Recommend:  No surgery or intervention at this point in time.    I have reviewed my previous discussion with the patient regarding swelling and why it causes symptoms.  Patient will continue wearing graduated compression stockings class 1 (20-30 mmHg) on a daily basis. The patient will  beginning wearing the stockings first thing in the morning and removing them in the evening. The patient is instructed specifically not to sleep in the stockings.    In addition, behavioral modification including several periods of elevation of the lower extremities during the day will be continued.  This was reviewed with the patient during the initial visit.  The patient will also continue routine exercise, especially walking on a daily basis  as was discussed during the initial visit.    Despite conservative treatments including graduated compression therapy class 1 and behavioral modification including exercise and elevation the patient  has not obtained adequate control of the lymphedema.  The patient still has stage 3 lymphedema and therefore, I believe that a lymph pump should be added to improve the control of the patient's lymphedema.  Additionally, a lymph pump is warranted because it will reduce the risk of cellulitis and ulceration in the future.  Patient should follow-up in six months    3. Venous ulcer of ankle, right [facility]) Now healed   Hortencia Pilar, MD  11/20/2017 8:35 AM

## 2017-12-14 ENCOUNTER — Telehealth (INDEPENDENT_AMBULATORY_CARE_PROVIDER_SITE_OTHER): Payer: Self-pay

## 2017-12-14 NOTE — Telephone Encounter (Signed)
Patient's mother came in on 12/13/17 very upset because [employer] had contacted them regarding the patient's lymph pump and it will cost $2000. She kept saying that Dr. Delana Meyer told her it would be free, I attempted to let her know that the cost of the pump is not up to the provider but the patient's insurance company as to how much or little his co-pay will be. I then contacted Matt with [employer] and he explained that when they went out, they did go over all of the details of the patients insurance with him and his mother with all up front costs as well. I then spoke with Dr. Delana Meyer and let him know about all of this and per Dr. Delana Meyer he said he let them know that some people do not have a co-pay depending on insurance.

## 2017-12-29 ENCOUNTER — Telehealth (INDEPENDENT_AMBULATORY_CARE_PROVIDER_SITE_OTHER): Payer: Self-pay

## 2017-12-29 NOTE — Telephone Encounter (Signed)
Patient's mother called wanting to cancel his order through [employer] for a lymphedema pump and go through someone else. The mother stated that [employer] would not order a $1500 pump for them to purchase. I will send the information to [employer] for them to attempt to get the patient a pump.

## 2018-01-01 ENCOUNTER — Encounter: Payer: Self-pay | Admitting: Student

## 2018-01-02 ENCOUNTER — Ambulatory Visit: Payer: BLUE CROSS/BLUE SHIELD | Admitting: Anesthesiology

## 2018-01-02 ENCOUNTER — Encounter: Payer: Self-pay | Admitting: *Deleted

## 2018-01-02 ENCOUNTER — Encounter
Admission: RE | Disposition: A | Discharge status: Home / Self Care | Payer: Self-pay | Source: Ambulatory Visit | Attending: Internal Medicine

## 2018-01-02 ENCOUNTER — Ambulatory Visit
Admission: RE | Admit: 2018-01-02 | Discharge: 2018-01-02 | Disposition: A | Discharge status: Home / Self Care | Payer: BLUE CROSS/BLUE SHIELD | Source: Ambulatory Visit | Attending: Internal Medicine | Admitting: Internal Medicine

## 2018-01-02 DIAGNOSIS — I739 Peripheral vascular disease, unspecified: Secondary | ICD-10-CM | POA: Diagnosis not present

## 2018-01-02 DIAGNOSIS — Z79899 Other long term (current) drug therapy: Secondary | ICD-10-CM | POA: Diagnosis not present

## 2018-01-02 DIAGNOSIS — Z880 Allergy status to penicillin: Secondary | ICD-10-CM | POA: Insufficient documentation

## 2018-01-02 DIAGNOSIS — Z1211 Encounter for screening for malignant neoplasm of colon: Secondary | ICD-10-CM | POA: Insufficient documentation

## 2018-01-02 DIAGNOSIS — Z888 Allergy status to other drugs, medicaments and biological substances status: Secondary | ICD-10-CM | POA: Diagnosis not present

## 2018-01-02 HISTORY — DX: Epilepsy, unspecified, not intractable, without status epilepticus: G40.909

## 2018-01-02 HISTORY — PX: COLONOSCOPY WITH PROPOFOL: SHX5780

## 2018-01-02 SURGERY — COLONOSCOPY WITH PROPOFOL
Anesthesia: General

## 2018-01-02 MED ORDER — SODIUM CHLORIDE 0.9 % IV SOLN
INTRAVENOUS | Status: DC
  Administered 2018-01-02: 13:00:00 via INTRAVENOUS

## 2018-01-02 MED ORDER — PROPOFOL 500 MG/50ML IV EMUL
INTRAVENOUS | Status: AC
  Filled 2018-01-02: qty 50

## 2018-01-02 MED ORDER — PROPOFOL 500 MG/50ML IV EMUL
INTRAVENOUS | Status: DC | PRN
  Administered 2018-01-02: 175 ug/kg/min via INTRAVENOUS

## 2018-01-02 MED ORDER — LIDOCAINE HCL (CARDIAC) PF 100 MG/5ML IV SOSY
PREFILLED_SYRINGE | INTRAVENOUS | Status: DC | PRN
  Administered 2018-01-02: 50 mg via INTRAVENOUS

## 2018-01-02 MED ORDER — PROPOFOL 10 MG/ML IV BOLUS
INTRAVENOUS | Status: DC | PRN
  Administered 2018-01-02: 30 mg via INTRAVENOUS
  Administered 2018-01-02: 60 mg via INTRAVENOUS
  Administered 2018-01-02: 50 mg via INTRAVENOUS
  Administered 2018-01-02: 40 mg via INTRAVENOUS

## 2018-01-02 NOTE — Op Note (Addendum)
[facility] Patient Name: David Lynn Procedure Date: 01/02/2018 1:10 PM MRN: [id number] Account #: [id number] Date of Birth: 05/16/1960 Admit Type: Outpatient Age: 57 Room: [facility] ENDO ROOM 2 Gender: Male Note Status: Finalized Procedure:            Colonoscopy Indications:          Screening for colorectal malignant neoplasm Providers:            Benay Pike. Feige Lowdermilk MD, MD Medicines:            Propofol per Anesthesia Complications:        No immediate complications. Procedure:            Pre-Anesthesia Assessment:                       - The risks and benefits of the procedure and the                        sedation options and risks were discussed with the                        patient. All questions were answered and informed                        consent was obtained.                       - Patient identification and proposed procedure were                        verified prior to the procedure by the nurse. The                        procedure was verified in the procedure room.                       - ASA Grade Assessment: II - A patient with mild                        systemic disease.                       - After reviewing the risks and benefits, the patient                        was deemed in satisfactory condition to undergo the                        procedure.                       After obtaining informed consent, the colonoscope was                        passed under direct vision. Throughout the procedure,                        the patient's blood pressure, pulse, and oxygen                        saturations were monitored continuously. The  Colonoscope was introduced through the anus and                        advanced to the the cecum, identified by appendiceal                        orifice and ileocecal valve. The patient tolerated the                        procedure well. The colonoscopy was  somewhat difficult                        due to significant looping. Successful completion of                        the procedure was aided by applying abdominal pressure.                        The quality of the bowel preparation was good. The                        ileocecal valve, appendiceal orifice, and rectum were                        photographed. The entire colon was examined. Findings:      The perianal and digital rectal examinations were normal. Pertinent       negatives include normal sphincter tone and no palpable rectal lesions.      The colon (entire examined portion) appeared normal.      The exam was otherwise without abnormality on direct and retroflexion       views. Impression:           - The entire examined colon is normal.                       - The examination was otherwise normal on direct and                        retroflexion views.                       - No specimens collected. Recommendation:       - Patient has a contact number available for                        emergencies. The signs and symptoms of potential                        delayed complications were discussed with the patient.                        Return to normal activities tomorrow. Written discharge                        instructions were provided to the patient.                       - Resume previous diet.                       -  Continue present medications.                       - Repeat colonoscopy in 10 years for screening purposes.                       - The findings and recommendations were discussed with                        the patient and their family.                       - Return to GI office PRN. Procedure Code(s):    --- Professional ---                       L3903, Colorectal cancer screening; colonoscopy on                        individual not meeting criteria for high risk Diagnosis Code(s):    --- Professional ---                       Z12.11, Encounter for  screening for malignant neoplasm                        of colon CPT copyright 2017 American Medical Association. All rights reserved. The codes documented in this report are preliminary and upon coder review may  be revised to meet current compliance requirements. Efrain Sella MD, MD 01/02/2018 1:39:57 PM This report has been signed electronically. Number of Addenda: 0 Note Initiated On: 01/02/2018 1:10 PM Scope Withdrawal Time: 0 hours 10 minutes 53 seconds  Total Procedure Duration: 0 hours 18 minutes 36 seconds       [facility]

## 2018-01-02 NOTE — H&P (Signed)
Outpatient short stay form Pre-procedure 01/02/2018 1:03 PM Teodoro K. Alice Reichert, M.D.  Primary Physician: [place] Pink, M.D.  Reason for visit: Colon cancer screening  History of present illness:  Patient presents for colonoscopy for colon cancer screening. The patient denies complaints of abdominal pain, significant change in bowel habits, or rectal bleeding.     Current Facility-Administered Medications:  .  0.9 %  sodium chloride infusion, , Intravenous, Continuous, Ivins, Benay Pike, MD, Last Rate: 20 mL/hr at 01/02/18 1250  Medications Prior to Admission  Medication Sig Dispense Refill Last Dose  . lamoTRIgine (LAMICTAL) 200 MG tablet Take by mouth.   01/02/2018 at Unknown time  . mupirocin ointment (BACTROBAN) 2 % Place 1 application into the nose as needed.   Not Taking at Unknown time     Allergies  Allergen Reactions  . Iodine Rash  . Penicillins Rash     Past Medical History:  Diagnosis Date  . Epilepsy ([place])   . Seizure disorder ([place])     Review of systems:  Otherwise negative.    Physical Exam  Gen: Alert, oriented. Appears stated age.  HEENT: [place]/AT. PERRLA. Lungs: CTA, no wheezes. CV: RR nl S1, S2. Abd: soft, benign, no masses. BS+ Ext: No edema. Pulses 2+    Planned procedures: Proceed with colonoscopy. The patient understands the nature of the planned procedure, indications, risks, alternatives and potential complications including but not limited to bleeding, infection, perforation, damage to internal organs and possible oversedation/side effects from anesthesia. The patient agrees and gives consent to proceed.  Please refer to procedure notes for findings, recommendations and patient disposition/instructions.     Teodoro K. Alice Reichert, M.D. Gastroenterology 01/02/2018  1:03 PM

## 2018-01-02 NOTE — Interval H&P Note (Signed)
History and Physical Interval Note:  01/02/2018 1:05 PM  David Lynn  has presented today for surgery, with the diagnosis of COLON CANCER SCREENING  The various methods of treatment have been discussed with the patient and family. After consideration of risks, benefits and other options for treatment, the patient has consented to  Procedure(s): COLONOSCOPY WITH PROPOFOL (N/A) as a surgical intervention .  The patient's history has been reviewed, patient examined, no change in status, stable for surgery.  I have reviewed the patient's chart and labs.  Questions were answered to the patient's satisfaction.     [place], [place]

## 2018-01-02 NOTE — Anesthesia Post-op Follow-up Note (Signed)
Anesthesia QCDR form completed.        

## 2018-01-02 NOTE — Anesthesia Preprocedure Evaluation (Addendum)
Anesthesia Evaluation  Patient identified by MRN, date of birth, ID band Patient awake    Reviewed: Allergy & Precautions, H&P , NPO status , Patient's Chart, lab work & pertinent test results  Airway Mallampati: II  TM Distance: >3 FB Neck ROM: full   Comment: beard Dental  (+) Teeth Intact, Poor Dentition   Pulmonary neg COPD, Current Smoker,     + decreased breath sounds      Cardiovascular Exercise Tolerance: Good (-) angina+ Peripheral Vascular Disease  (-) Past MI, (-) Cardiac Stents, (-) CABG and (-) CHF  Rhythm:regular Rate:Normal     Neuro/Psych Seizures -,  negative psych ROS   GI/Hepatic negative GI ROS, Neg liver ROS,   Endo/Other  negative endocrine ROS  Renal/GU negative Renal ROS  negative genitourinary   Musculoskeletal   Abdominal   Peds  Hematology negative hematology ROS (+)   Anesthesia Other Findings Past Medical History: No date: Epilepsy ([place]) No date: Seizure disorder ([place])  Past Surgical History: No date: HYDROCELE EXCISION  BMI    Body Mass Index:  32.82 kg/m      Reproductive/Obstetrics negative OB ROS                            Anesthesia Physical Anesthesia Plan  ASA: III  Anesthesia Plan: General   Post-op Pain Management:    Induction:   PONV Risk Score and Plan: Propofol infusion and TIVA  Airway Management Planned: Natural Airway and Nasal Cannula  Additional Equipment:   Intra-op Plan:   Post-operative Plan:   Informed Consent: I have reviewed the patients History and Physical, chart, labs and discussed the procedure including the risks, benefits and alternatives for the proposed anesthesia with the patient or authorized representative who has indicated his/her understanding and acceptance.   Dental Advisory Given  Plan Discussed with: Anesthesiologist, CRNA and Surgeon  Anesthesia Plan Comments:        Anesthesia Quick  Evaluation

## 2018-01-02 NOTE — Anesthesia Procedure Notes (Signed)
Date/Time: 01/02/2018 1:17 PM Performed by: Johnna Acosta, CRNA Pre-anesthesia Checklist: Patient identified, Emergency Drugs available, Suction available, Patient being monitored and Timeout performed Patient Re-evaluated:Patient Re-evaluated prior to induction Oxygen Delivery Method: Nasal cannula Preoxygenation: Pre-oxygenation with 100% oxygen Induction Type: IV induction

## 2018-01-02 NOTE — Transfer of Care (Signed)
Immediate Anesthesia Transfer of Care Note  Patient: CURLEY HOGEN  Procedure(s) Performed: COLONOSCOPY WITH PROPOFOL (N/A )  Patient Location: PACU  Anesthesia Type:General  Level of Consciousness: awake  Airway & Oxygen Therapy: spontaneous respirations   Post-op Assessment: Report given to RN and Post -op Vital signs reviewed and stable  Post vital signs: Reviewed and stable  Last Vitals:  Vitals Value Taken Time  BP 106/82 01/02/2018  1:43 PM  Temp 36 C 01/02/2018  1:40 PM  Pulse 55 01/02/2018  1:46 PM  Resp 14 01/02/2018  1:46 PM  SpO2 95 % 01/02/2018  1:46 PM  Vitals shown include unvalidated device data.  Last Pain:  Vitals:   01/02/18 1340  TempSrc: Tympanic         Complications: Patient re-intubated

## 2018-01-04 ENCOUNTER — Encounter: Payer: Self-pay | Admitting: Internal Medicine

## 2018-01-04 NOTE — Anesthesia Postprocedure Evaluation (Signed)
Anesthesia Post Note  Patient: David Lynn  Procedure(s) Performed: COLONOSCOPY WITH PROPOFOL (N/A )  Patient location during evaluation: PACU Anesthesia Type: General Level of consciousness: awake and alert Pain management: pain level controlled Vital Signs Assessment: post-procedure vital signs reviewed and stable Respiratory status: spontaneous breathing, nonlabored ventilation and respiratory function stable Cardiovascular status: blood pressure returned to baseline and stable Postop Assessment: no apparent nausea or vomiting Anesthetic complications: no     Last Vitals:  Vitals:   01/02/18 1410 01/02/18 1411  BP: 116/84 120/84  Pulse: (!) 55 (!) 51  Resp: 16 15  Temp:    SpO2: 98% 98%    Last Pain:  Vitals:   01/02/18 1405  TempSrc:   PainSc: [place]

## 2018-01-10 ENCOUNTER — Emergency Department
Admission: EM | Admit: 2018-01-10 | Discharge: 2018-01-10 | Disposition: A | Discharge status: Home / Self Care | Payer: BLUE CROSS/BLUE SHIELD | Attending: Emergency Medicine | Admitting: Emergency Medicine

## 2018-01-10 ENCOUNTER — Emergency Department: Payer: BLUE CROSS/BLUE SHIELD

## 2018-01-10 DIAGNOSIS — S0081XA Abrasion of other part of head, initial encounter: Secondary | ICD-10-CM | POA: Insufficient documentation

## 2018-01-10 DIAGNOSIS — Y998 Other external cause status: Secondary | ICD-10-CM | POA: Insufficient documentation

## 2018-01-10 DIAGNOSIS — Y9389 Activity, other specified: Secondary | ICD-10-CM | POA: Insufficient documentation

## 2018-01-10 DIAGNOSIS — R569 Unspecified convulsions: Secondary | ICD-10-CM

## 2018-01-10 DIAGNOSIS — F17210 Nicotine dependence, cigarettes, uncomplicated: Secondary | ICD-10-CM | POA: Insufficient documentation

## 2018-01-10 DIAGNOSIS — W01198A Fall on same level from slipping, tripping and stumbling with subsequent striking against other object, initial encounter: Secondary | ICD-10-CM | POA: Insufficient documentation

## 2018-01-10 DIAGNOSIS — Z23 Encounter for immunization: Secondary | ICD-10-CM | POA: Diagnosis not present

## 2018-01-10 DIAGNOSIS — S01511A Laceration without foreign body of lip, initial encounter: Secondary | ICD-10-CM | POA: Diagnosis not present

## 2018-01-10 DIAGNOSIS — S0292XA Unspecified fracture of facial bones, initial encounter for closed fracture: Secondary | ICD-10-CM | POA: Diagnosis not present

## 2018-01-10 DIAGNOSIS — T148XXA Other injury of unspecified body region, initial encounter: Secondary | ICD-10-CM

## 2018-01-10 DIAGNOSIS — Y92512 Supermarket, store or market as the place of occurrence of the external cause: Secondary | ICD-10-CM | POA: Insufficient documentation

## 2018-01-10 DIAGNOSIS — S01512A Laceration without foreign body of oral cavity, initial encounter: Secondary | ICD-10-CM | POA: Insufficient documentation

## 2018-01-10 DIAGNOSIS — S00502A Unspecified superficial injury of oral cavity, initial encounter: Secondary | ICD-10-CM | POA: Diagnosis present

## 2018-01-10 LAB — BASIC METABOLIC PANEL
Anion gap: 13 (ref 5–15)
BUN: 16 mg/dL (ref 6–20)
CALCIUM: 9.6 mg/dL (ref 8.9–10.3)
CO2: 20 mmol/L — ABNORMAL LOW (ref 22–32)
Chloride: 108 mmol/L (ref 98–111)
Creatinine, Ser: 1.27 mg/dL — ABNORMAL HIGH (ref 0.61–1.24)
GFR calc Af Amer: >60 mL/min (ref >60)
Glucose, Bld: 115 mg/dL — ABNORMAL HIGH (ref 70–99)
Potassium: 4 mmol/L (ref 3.5–5.1)
Sodium: 141 mmol/L (ref 135–145)

## 2018-01-10 LAB — CBC WITH DIFFERENTIAL/PLATELET
Basophils Absolute: 0.1 10*3/uL (ref 0–0.1)
Basophils Relative: 1 %
EOS PCT: 3 %
Eosinophils Absolute: 0.2 10*3/uL (ref 0–0.7)
HCT: 42.9 % (ref 40.0–52.0)
Hemoglobin: 14.7 g/dL (ref 13.0–18.0)
LYMPHS ABS: 0.7 10*3/uL — AB (ref 1.0–3.6)
LYMPHS PCT: 9 %
MCH: 31.5 pg (ref 26.0–34.0)
MCHC: 34.3 g/dL (ref 32.0–36.0)
MCV: 91.8 fL (ref 80.0–100.0)
MONO ABS: 0.7 10*3/uL (ref 0.2–1.0)
Monocytes Relative: 10 %
Neutro Abs: 5.6 10*3/uL (ref 1.4–6.5)
Neutrophils Relative %: 77 %
PLATELETS: 317 10*3/uL (ref 150–440)
RBC: 4.67 MIL/uL (ref 4.40–5.90)
RDW: 15 % — AB (ref 11.5–14.5)
WBC: 7.3 10*3/uL (ref 3.8–10.6)

## 2018-01-10 MED ORDER — TETANUS-DIPHTH-ACELL PERTUSSIS 5-2.5-18.5 LF-MCG/0.5 IM SUSP
0.5000 mL | Freq: Once | INTRAMUSCULAR | Status: AC
  Administered 2018-01-10: 0.5 mL via INTRAMUSCULAR
  Filled 2018-01-10: qty 0.5

## 2018-01-10 MED ORDER — LORAZEPAM 1 MG PO TABS
1.0000 mg | ORAL_TABLET | Freq: Once | ORAL | Status: AC
  Administered 2018-01-10: 1 mg via ORAL
  Filled 2018-01-10: qty 1

## 2018-01-10 MED ORDER — SODIUM CHLORIDE 0.9 % IV BOLUS
1000.0000 mL | Freq: Once | INTRAVENOUS | Status: AC
  Administered 2018-01-10: 1000 mL via INTRAVENOUS

## 2018-01-10 MED ORDER — AMOXICILLIN-POT CLAVULANATE 875-125 MG PO TABS
1.0000 | ORAL_TABLET | Freq: Once | ORAL | Status: AC
  Administered 2018-01-10: 1 via ORAL
  Filled 2018-01-10: qty 1

## 2018-01-10 MED ORDER — AMOXICILLIN-POT CLAVULANATE 875-125 MG PO TABS: 1.0000 | ORAL_TABLET | Freq: Two times a day (BID) | ORAL | 0 refills | Status: AC

## 2018-01-10 NOTE — ED Provider Notes (Addendum)
[facility] Emergency Department Provider Note ____________________________________________   First MD Initiated Contact with Patient 01/10/18 1550     (approximate)  I have reviewed the triage vital signs and the nursing notes.   HISTORY  Chief Complaint Seizures and Laceration   HPI David Lynn is a 57 y.o. male with a history of epilepsy on lamotrigine, 400 mg twice daily, who is presenting the emergency department after seizure.  EMS reports the patient was found outside of [employer], facedown in of pool of vomit.  Slightly confused.  EMS did not witness the seizure.  Patient denies any prodrome and says that he does not have any prodrome before his seizures.  Denies any pain at this time.  Reports compliance with his medicines.  Says that he has not had a seizure in 6 months his wife states that it is been more like 12 to 15 years.  Patient sees a neurologist [facility].  Denies feeling ill lately.  Denies lack of sleep or lack of food or drink.  Does not know the date of his last tetanus shot.  Past Medical History:  Diagnosis Date  . Epilepsy ([place])   . Seizure disorder [facility])     Patient Active Problem List   Diagnosis Date Noted  . Lymphedema 11/20/2017  . Venous ulcer of ankle, right ([place]) 03/07/2017  . Chronic venous insufficiency 03/07/2017  . Varicose veins of both lower extremities with pain 03/07/2017    Past Surgical History:  Procedure Laterality Date  . COLONOSCOPY WITH PROPOFOL N/A 01/02/2018   Procedure: COLONOSCOPY WITH PROPOFOL;  Surgeon: Toledo, Benay Pike, MD;  Location: [facility] ENDOSCOPY;  Service: Gastroenterology;  Laterality: N/A;  . HYDROCELE EXCISION      Prior to Admission medications   Medication Sig Start Date End Date Taking? Authorizing Provider  lamoTRIgine (LAMICTAL) 200 MG tablet Take by mouth. 05/30/16   [provider]  mupirocin ointment (BACTROBAN) 2 % Place 1 application into the nose as needed.     [provider]    Allergies Iodine and Penicillins  Family History  Problem Relation Age of Onset  . Heart attack Father     Social History Social History   Tobacco Use  . Smoking status: Current Every Day Smoker    Packs/day: 1.00    Types: Cigarettes  . Smokeless tobacco: Never Used  Substance Use Topics  . Alcohol use: No    Frequency: Never  . Drug use: No    Review of Systems  Constitutional: No fever/chills Eyes: No visual changes. ENT: No sore throat. Cardiovascular: Denies chest pain. Respiratory: Denies shortness of breath. Gastrointestinal: No abdominal pain.  No nausea, no vomiting.  No diarrhea.  No constipation. Genitourinary: Negative for dysuria. Musculoskeletal: Negative for back pain. Skin: Negative for rash. Neurological: Negative for headaches, focal weakness or numbness.   ____________________________________________   PHYSICAL EXAM:  VITAL SIGNS: ED Triage Vitals  Enc Vitals Group     BP 01/10/18 1547 129/80     Pulse Rate 01/10/18 1547 93     Resp 01/10/18 1547 (!) 24     Temp 01/10/18 1547 98.1 F (36.7 C)     Temp Source 01/10/18 1547 Oral     SpO2 01/10/18 1545 99 %     Weight 01/10/18 1549 260 lb (117.9 kg)     Height 01/10/18 1549 5\' 11"  (1.803 m)     Head Circumference --      Peak Flow --  Pain Score 01/10/18 1549 0     Pain Loc --      Pain Edu? --      Excl. in [place]? --     Constitutional: Alert and oriented. Well appearing and in no acute distress. Eyes: Conjunctivae are normal. EOMI.   Head: Abrasion across the front of forehead which is approximately 6 x 1 cm and rectangular.  No laceration, no depression.  Also 1 cm x 2 cm laceration left lateral lip without laceration.  Very small laceration to the upper left mucosal surface of the lip which is not involve the vermilion border.  Wound edges are well approximated.  No active bleeding.  Ecchymosis over the left zygomatic region. Nose: No  congestion/rhinnorhea. Mouth/Throat: Mucous membranes are moist.  Small tongue laceration only about 1 mm deep to the right side of the tongue which is approximately 0.5 cm in length.  No active bleeding. Neck: No stridor.   Cardiovascular: Normal rate, regular rhythm. Grossly normal heart sounds.  Good peripheral circulation. Respiratory: Normal respiratory effort.  No retractions. Lungs CTAB. Gastrointestinal: Soft and nontender. No distention. No CVA tenderness. Musculoskeletal: No lower extremity tenderness nor edema.  No joint effusions. Neurologic:  Normal speech and language. No gross focal neurologic deficits are appreciated. Skin:  Skin is warm, dry and intact. No rash noted. Psychiatric: Mood and affect are normal. Speech and behavior are normal.  ____________________________________________   LABS (all labs ordered are listed, but only abnormal results are displayed)  Labs Reviewed  CBC WITH DIFFERENTIAL/PLATELET - Abnormal; Notable for the following components:      Result Value   RDW 15.0 (*)    Lymphs Abs 0.7 (*)    All other components within normal limits  BASIC METABOLIC PANEL - Abnormal; Notable for the following components:   CO2 20 (*)    Glucose, Bld 115 (*)    Creatinine, Ser 1.27 (*)    All other components within normal limits   ____________________________________________  EKG  ED ECG REPORT I, Doran Stabler, the attending physician, personally viewed and interpreted this ECG.   Date: 01/10/2018  EKG Time: 1554  Rate: 93  Rhythm: normal sinus rhythm  Axis: Normal  Intervals:none  ST&T Change: No ST segment elevation or depression.  No abnormal T wave inversion.  ____________________________________________  RADIOLOGY  Negative noncontrast CT brain.  No definitive acute displaced facial bone fracture.  However, there is fluid level and hemorrhagic or proteinaceous debris in the left maxillary sinus.  Occult fracture is possible.  Possibly  remote injury in the mandible as well.  However, nothing acute visualized. ____________________________________________   PROCEDURES  Procedure(s) performed:   Procedures  Critical Care performed:   ____________________________________________   INITIAL IMPRESSION / ASSESSMENT AND PLAN / ED COURSE  Pertinent labs & imaging results that were available during my care of the patient were reviewed by me and considered in my medical decision making (see chart for details).  DDX: Seizure, vomiting, skull fracture, abrasion, lip laceration, tongue bite, facial bone fracture As part of my medical decision making, I reviewed the following data within the [place] past neurology notes, last in February 2019.0  ----------------------------------------- 5:11 PM on 01/10/2018 -----------------------------------------  I discussed the case with Dr. Caroline More at [facility], the [facility] neurologic service, who does not recommend any medication changes at this time.  He says that he will send a note to Dr. Marcia Brash regarding the patient's seizure here in the emergency department.  Says that  Dr. Si Raider note stated the patient had a last seizure in 2015.  Patient at this time saying that the base of his neck is hurting.  However, he is able to fully range his neck.  Do not appreciate any midline tenderness, step-off or deformity.  No neurologic deficits.  Also without any chest wall tenderness, bruising or crepitus.  No tenderness to the upper thoracic spine.  He does have bilateral tenderness to the trapezius muscles.  We discussed his imaging results.  He says that he is allergic to penicillin but does fine with amoxicillin.  He will be given Augmentin for a possible occult facial fracture.  Wife is also concerned that the patient may seize when he goes home is the last time he had a seizure she says that he had a second seizure once discharged.  We will give him a 1 mg tab of Ativan and he  will take his lamotrigine, second dose, at this time.  ____________________________________________   FINAL CLINICAL IMPRESSION(S) / ED DIAGNOSES  Seizure.  Facial abrasion.  Facial bone fracture.  Tongue laceration.  Lip laceration.  NEW MEDICATIONS STARTED DURING THIS VISIT:  New Prescriptions   No medications on file     Note:  This document was prepared using Dragon voice recognition software and may include unintentional dictation errors.     Orbie Pyo, MD 01/10/18 1714    Orbie Pyo, MD 01/10/18 [phone]

## 2018-01-10 NOTE — ED Notes (Signed)
Pt family arrives at bedside Pt gave verbal permission to update family if they arrived while he was gone. Family updated on arrival circumstances and current condition.

## 2018-01-10 NOTE — ED Triage Notes (Signed)
Pt arrived via [facility] with c/c of seizure and head injury. 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 EMS found him conscious/ post seizure with blood on his face coming from a wound to his forehead and a wound on his lip. Pt was confused but EMS reports he became A&O x3 during the transport. Pt states he has a Hx of seizures and is taking lomictal but was unsure of the dose.

## 2018-01-10 NOTE — ED Notes (Signed)
Patient transported to CT 

## 2018-05-21 ENCOUNTER — Encounter (INDEPENDENT_AMBULATORY_CARE_PROVIDER_SITE_OTHER): Payer: Self-pay | Admitting: Vascular Surgery

## 2018-05-21 ENCOUNTER — Ambulatory Visit (INDEPENDENT_AMBULATORY_CARE_PROVIDER_SITE_OTHER): Payer: BLUE CROSS/BLUE SHIELD | Admitting: Vascular Surgery

## 2018-05-21 VITALS — BP 114/69 | HR 67 | Resp 14 | Ht 71.5 in | Wt 241.2 lb

## 2018-05-21 DIAGNOSIS — I890 Lymphedema, not elsewhere classified: Secondary | ICD-10-CM

## 2018-05-21 DIAGNOSIS — I83813 Varicose veins of bilateral lower extremities with pain: Secondary | ICD-10-CM

## 2018-05-21 DIAGNOSIS — I872 Venous insufficiency (chronic) (peripheral): Secondary | ICD-10-CM

## 2018-05-21 NOTE — Progress Notes (Signed)
MRN : [id number]  David Lynn is a 58 y.o. (1960-07-08) male who presents with chief complaint of  Chief Complaint  Patient presents with  . Follow-up  .  History of Present Illness:   The patient returns to the office for followup evaluation regarding leg swelling.  The swelling has persisted but with the lymph pump is much, much better controlled. The pain associated with swelling is essentially eliminated. There have not been any interval development of a ulcerations or wounds.  The patient denies problems with the pump, noting it is working well and the leggings are in good condition.  Since the previous visit the patient has been wearing graduated compression stockings and using the lymph pump on a routine basis and  has noted significant improvement in the lymphedema.   Patient stated the lymph pump has been a very positive factor in her care.    Current Meds  Medication Sig  . lamoTRIgine (LAMICTAL) 200 MG tablet Take by mouth.  . mupirocin ointment (BACTROBAN) 2 % Place 1 application into the nose as needed.    Past Medical History:  Diagnosis Date  . Epilepsy ([place])   . Seizure disorder [facility])     Past Surgical History:  Procedure Laterality Date  . COLONOSCOPY WITH PROPOFOL N/A 01/02/2018   Procedure: COLONOSCOPY WITH PROPOFOL;  Surgeon: Toledo, Benay Pike, MD;  Location: [facility] ENDOSCOPY;  Service: Gastroenterology;  Laterality: N/A;  . HYDROCELE EXCISION      Social History Social History   Tobacco Use  . Smoking status: Current Every Day Smoker    Packs/day: 1.00    Types: Cigarettes  . Smokeless tobacco: Never Used  Substance Use Topics  . Alcohol use: No    Frequency: Never  . Drug use: No    Family History Family History  Problem Relation Age of Onset  . Heart attack Father     Allergies  Allergen Reactions  . Iodine Rash  . Penicillins Rash     REVIEW OF SYSTEMS (Negative unless checked)  Constitutional: [] Weight loss  [] Fever   [] Chills Cardiac: [] Chest pain   [] Chest pressure   [] Palpitations   [] Shortness of breath when laying flat   [] Shortness of breath with exertion. Vascular:  [] Pain in legs with walking   [x] Pain in legs at rest  [] History of DVT   [] Phlebitis   [x] Swelling in legs   [x] Varicose veins   [] Non-healing ulcers Pulmonary:   [] Uses home oxygen   [] Productive cough   [] Hemoptysis   [] Wheeze  [] COPD   [] Asthma Neurologic:  [] Dizziness   [] Seizures   [] History of stroke   [] History of TIA  [] Aphasia   [] Vissual changes   [] Weakness or numbness in arm   [] Weakness or numbness in leg Musculoskeletal:   [] Joint swelling   [] Joint pain   [] Low back pain Hematologic:  [] Easy bruising  [] Easy bleeding   [] Hypercoagulable state   [] Anemic Gastrointestinal:  [] Diarrhea   [] Vomiting  [] Gastroesophageal reflux/heartburn   [] Difficulty swallowing. Genitourinary:  [] Chronic kidney disease   [] Difficult urination  [] Frequent urination   [] Blood in urine Skin:  [] Rashes   [] Ulcers  Psychological:  [] History of anxiety   []  History of major depression.  Physical Examination  Vitals:   05/21/18 0836  BP: 114/69  Pulse: 67  Resp: 14  Weight: 241 lb 3.2 oz (109.4 kg)  Height: 5' 11.5" (1.816 m)   Body mass index is 33.17 kg/m. Gen: WD/WN, NAD Head: [place]/AT, No temporalis wasting.  Ear/Nose/Throat: Hearing grossly intact, nares w/o erythema or drainage Eyes: PER, EOMI, sclera nonicteric.  Neck: Supple, no large masses.   Pulmonary:  Good air movement, no audible wheezing bilaterally, no use of accessory muscles.  Cardiac: RRR, no JVD Vascular: scattered varicosities present bilaterally.  Moderate venous stasis changes to the legs bilaterally.  2+ soft pitting edema Vessel Right Left  Radial Palpable Palpable  Gastrointestinal: Non-distended. No guarding/no peritoneal signs.  Musculoskeletal: M/S 5/5 throughout.  No deformity or atrophy.  Neurologic: CN 2-12 intact. Symmetrical.  Speech is fluent. Motor exam  as listed above. Psychiatric: Judgment intact, Mood & affect appropriate for pt's clinical situation. Dermatologic: Moderate venous  rashes no ulcers noted.  No changes consistent with cellulitis. Lymph : No lichenification or skin changes of chronic lymphedema.  CBC Lab Results  Component Value Date   WBC 7.3 01/10/2018   HGB 14.7 01/10/2018   HCT 42.9 01/10/2018   MCV 91.8 01/10/2018   PLT 317 01/10/2018    BMET    Component Value Date/Time   NA 141 01/10/2018 1555   NA 137 05/14/2013 1737   K 4.0 01/10/2018 1555   K 4.3 05/14/2013 1737   CL 108 01/10/2018 1555   CL 105 05/14/2013 1737   CO2 20 (L) 01/10/2018 1555   CO2 24 05/14/2013 1737   GLUCOSE 115 (H) 01/10/2018 1555   GLUCOSE 115 (H) 05/14/2013 1737   BUN 16 01/10/2018 1555   BUN 15 05/14/2013 1737   CREATININE 1.27 (H) 01/10/2018 1555   CREATININE 0.96 05/14/2013 1737   CALCIUM 9.6 01/10/2018 1555   CALCIUM 8.1 (L) 05/14/2013 1737   GFRNONAA >60 01/10/2018 1555   GFRNONAA >60 05/14/2013 1737   GFRAA >60 01/10/2018 1555   GFRAA >60 05/14/2013 1737   CrCl cannot be calculated (Patient's most recent lab result is older than the maximum 21 days allowed.).  COAG No results found for: INR, PROTIME  Radiology No results found.   Assessment/Plan 1. Chronic venous insufficiency  No surgery or intervention at this point in time.    I have reviewed my discussion with the patient regarding lymphedema and why it  causes symptoms.  Patient will continue wearing graduated compression stockings class 1 (20-30 mmHg) on a daily basis a prescription was given. The patient is reminded to put the stockings on first thing in the morning and removing them in the evening. The patient is instructed specifically not to sleep in the stockings.   In addition, behavioral modification throughout the day will be continued.  This will include frequent elevation (such as in a recliner), use of over the counter pain medications as needed  and exercise such as walking.  I have reviewed systemic causes for chronic edema such as liver, kidney and cardiac etiologies and there does not appear to be any significant changes in these organ systems over the past year.  The patient is under the impression that these organ systems are all stable and unchanged.    The patient will continue aggressive use of the  lymph pump.  This will continue to improve the edema control and prevent sequela such as ulcers and infections.   The patient will follow-up with me on an annual basis.    2. Lymphedema  No surgery or intervention at this point in time.    I have reviewed my discussion with the patient regarding lymphedema and why it  causes symptoms.  Patient will continue wearing graduated compression stockings class 1 (20-30 mmHg) on  a daily basis a prescription was given. The patient is reminded to put the stockings on first thing in the morning and removing them in the evening. The patient is instructed specifically not to sleep in the stockings.   In addition, behavioral modification throughout the day will be continued.  This will include frequent elevation (such as in a recliner), use of over the counter pain medications as needed and exercise such as walking.  I have reviewed systemic causes for chronic edema such as liver, kidney and cardiac etiologies and there does not appear to be any significant changes in these organ systems over the past year.  The patient is under the impression that these organ systems are all stable and unchanged.    The patient will continue aggressive use of the  lymph pump.  This will continue to improve the edema control and prevent sequela such as ulcers and infections.   The patient will follow-up with me on an annual basis.    3. Varicose veins of both lower extremities with pain  No surgery or intervention at this point in time.    I have reviewed my discussion with the patient regarding lymphedema and  why it  causes symptoms.  Patient will continue wearing graduated compression stockings class 1 (20-30 mmHg) on a daily basis a prescription was given. The patient is reminded to put the stockings on first thing in the morning and removing them in the evening. The patient is instructed specifically not to sleep in the stockings.   In addition, behavioral modification throughout the day will be continued.  This will include frequent elevation (such as in a recliner), use of over the counter pain medications as needed and exercise such as walking.  I have reviewed systemic causes for chronic edema such as liver, kidney and cardiac etiologies and there does not appear to be any significant changes in these organ systems over the past year.  The patient is under the impression that these organ systems are all stable and unchanged.    The patient will continue aggressive use of the  lymph pump.  This will continue to improve the edema control and prevent sequela such as ulcers and infections.   The patient will follow-up with me on an annual basis.      Hortencia Pilar, MD  05/21/2018 8:40 AM

## 2018-10-29 ENCOUNTER — Ambulatory Visit (INDEPENDENT_AMBULATORY_CARE_PROVIDER_SITE_OTHER): Payer: BC Managed Care – PPO | Admitting: Vascular Surgery

## 2018-10-29 ENCOUNTER — Other Ambulatory Visit: Payer: Self-pay

## 2018-10-29 ENCOUNTER — Encounter (INDEPENDENT_AMBULATORY_CARE_PROVIDER_SITE_OTHER): Payer: Self-pay | Admitting: Vascular Surgery

## 2018-10-29 VITALS — BP 111/67 | HR 67 | Resp 16 | Ht 71.5 in | Wt 233.0 lb

## 2018-10-29 DIAGNOSIS — I890 Lymphedema, not elsewhere classified: Secondary | ICD-10-CM

## 2018-10-29 DIAGNOSIS — I872 Venous insufficiency (chronic) (peripheral): Secondary | ICD-10-CM | POA: Diagnosis not present

## 2018-10-29 NOTE — Progress Notes (Signed)
MRN : [id number]  David Lynn is a 58 y.o. (May 21, 1960) male who presents with chief complaint of  Chief Complaint  Patient presents with   Follow-up    1year follow up  .  History of Present Illness:   The patient returns to the office for followup evaluation regarding leg swelling.  He state no major changes since last year.  The swelling has persisted but with the lymph pump is much, much better controlled. The pain associated with swelling is essentially eliminated. There have not been any interval development of a ulcerations or wounds.  The patient denies problems with the pump, noting it is working well and the leggings are in good condition.  Since the previous visit the patient has been wearing graduated compression stockings and using the lymph pump on a routine basis and  has noted significant improvement in the lymphedema.   Patient stated the lymph pump has been a very positive factor in her care.   Current Meds  Medication Sig   lamoTRIgine (LAMICTAL) 200 MG tablet Take by mouth.   mupirocin ointment (BACTROBAN) 2 % Place 1 application into the nose as needed.    Past Medical History:  Diagnosis Date   Epilepsy ([place])    Seizure disorder [facility])     Past Surgical History:  Procedure Laterality Date   COLONOSCOPY WITH PROPOFOL N/A 01/02/2018   Procedure: COLONOSCOPY WITH PROPOFOL;  Surgeon: Toledo, Benay Pike, MD;  Location: [facility] ENDOSCOPY;  Service: Gastroenterology;  Laterality: N/A;   HYDROCELE EXCISION      Social History Social History   Tobacco Use   Smoking status: Current Every Day Smoker    Packs/day: 1.00    Types: Cigarettes   Smokeless tobacco: Never Used  Substance Use Topics   Alcohol use: No    Frequency: Never   Drug use: No    Family History Family History  Problem Relation Age of Onset   Heart attack Father     Allergies  Allergen Reactions   Iodine Rash   Penicillins Rash     REVIEW OF SYSTEMS  (Negative unless checked)  Constitutional: [] Weight loss  [] Fever  [] Chills Cardiac: [] Chest pain   [] Chest pressure   [] Palpitations   [] Shortness of breath when laying flat   [] Shortness of breath with exertion. Vascular:  [] Pain in legs with walking   [] Pain in legs at rest  [] History of DVT   [] Phlebitis   [x] Swelling in legs   [x] Varicose veins   [] Non-healing ulcers Pulmonary:   [] Uses home oxygen   [] Productive cough   [] Hemoptysis   [] Wheeze  [] COPD   [] Asthma Neurologic:  [] Dizziness   [] Seizures   [] History of stroke   [] History of TIA  [] Aphasia   [] Vissual changes   [] Weakness or numbness in arm   [] Weakness or numbness in leg Musculoskeletal:   [] Joint swelling   [] Joint pain   [] Low back pain Hematologic:  [] Easy bruising  [] Easy bleeding   [] Hypercoagulable state   [] Anemic Gastrointestinal:  [] Diarrhea   [] Vomiting  [] Gastroesophageal reflux/heartburn   [] Difficulty swallowing. Genitourinary:  [] Chronic kidney disease   [] Difficult urination  [] Frequent urination   [] Blood in urine Skin:  [] Rashes   [] Ulcers  Psychological:  [] History of anxiety   []  History of major depression.  Physical Examination  Vitals:   10/29/18 0842  BP: 111/67  Pulse: 67  Resp: 16  Weight: 233 lb (105.7 kg)  Height: 5' 11.5" (1.816 m)   Body mass  index is 32.04 kg/m. Gen: WD/WN, NAD Head: [place]/AT, No temporalis wasting.  Ear/Nose/Throat: Hearing grossly intact, nares w/o erythema or drainage Eyes: PER, EOMI, sclera nonicteric.  Neck: Supple, no large masses.   Pulmonary:  Good air movement, no audible wheezing bilaterally, no use of accessory muscles.  Cardiac: RRR, no JVD Vascular: Large varicosities present extensively greater than 10 mm right leg.  Severe venous stasis changes to the legs bilaterally.  2+ soft pitting edema Vessel Right Left  Radial Palpable Palpable  PT Palpable Palpable  DP Palpable Palpable  Gastrointestinal: Non-distended. No guarding/no peritoneal signs.    Musculoskeletal: M/S 5/5 throughout.  No deformity or atrophy.  Neurologic: CN 2-12 intact. Symmetrical.  Speech is fluent. Motor exam as listed above. Psychiatric: Judgment intact, Mood & affect appropriate for pt's clinical situation. Dermatologic: No rashes or ulcers noted.  No changes consistent with cellulitis. Lymph : No lichenification or skin changes of chronic lymphedema.  CBC Lab Results  Component Value Date   WBC 7.3 01/10/2018   HGB 14.7 01/10/2018   HCT 42.9 01/10/2018   MCV 91.8 01/10/2018   PLT 317 01/10/2018    BMET    Component Value Date/Time   NA 141 01/10/2018 1555   NA 137 05/14/2013 1737   K 4.0 01/10/2018 1555   K 4.3 05/14/2013 1737   CL 108 01/10/2018 1555   CL 105 05/14/2013 1737   CO2 20 (L) 01/10/2018 1555   CO2 24 05/14/2013 1737   GLUCOSE 115 (H) 01/10/2018 1555   GLUCOSE 115 (H) 05/14/2013 1737   BUN 16 01/10/2018 1555   BUN 15 05/14/2013 1737   CREATININE 1.27 (H) 01/10/2018 1555   CREATININE 0.96 05/14/2013 1737   CALCIUM 9.6 01/10/2018 1555   CALCIUM 8.1 (L) 05/14/2013 1737   GFRNONAA >60 01/10/2018 1555   GFRNONAA >60 05/14/2013 1737   GFRAA >60 01/10/2018 1555   GFRAA >60 05/14/2013 1737   CrCl cannot be calculated (Patient's most recent lab result is older than the maximum 21 days allowed.).  COAG No results found for: INR, PROTIME  Radiology No results found.   Assessment/Plan 1. Chronic venous insufficiency  No surgery or intervention at this point in time.    I have reviewed my discussion with the patient regarding lymphedema and why it  causes symptoms.  Patient will continue wearing graduated compression stockings class 1 (20-30 mmHg) on a daily basis a prescription was given. The patient is reminded to put the stockings on first thing in the morning and removing them in the evening. The patient is instructed specifically not to sleep in the stockings.   In addition, behavioral modification throughout the day will  be continued.  This will include frequent elevation (such as in a recliner), use of over the counter pain medications as needed and exercise such as walking.  I have reviewed systemic causes for chronic edema such as liver, kidney and cardiac etiologies and there does not appear to be any significant changes in these organ systems over the past year.  The patient is under the impression that these organ systems are all stable and unchanged.    The patient will continue aggressive use of the  lymph pump.  This will continue to improve the edema control and prevent sequela such as ulcers and infections.   The patient will follow-up with me on an annual basis.   2. Lymphedema No surgery or intervention at this point in time.    I have reviewed my discussion  with the patient regarding lymphedema and why it  causes symptoms.  Patient will continue wearing graduated compression stockings class 1 (20-30 mmHg) on a daily basis a prescription was given. The patient is reminded to put the stockings on first thing in the morning and removing them in the evening. The patient is instructed specifically not to sleep in the stockings.   In addition, behavioral modification throughout the day will be continued.  This will include frequent elevation (such as in a recliner), use of over the counter pain medications as needed and exercise such as walking.  I have reviewed systemic causes for chronic edema such as liver, kidney and cardiac etiologies and there does not appear to be any significant changes in these organ systems over the past year.  The patient is under the impression that these organ systems are all stable and unchanged.    The patient will continue aggressive use of the  lymph pump.  This will continue to improve the edema control and prevent sequela such as ulcers and infections.   The patient will follow-up with me on an annual basis.    Hortencia Pilar, MD  10/29/2018 8:47 AM

## 2019-05-27 ENCOUNTER — Ambulatory Visit (INDEPENDENT_AMBULATORY_CARE_PROVIDER_SITE_OTHER): Payer: BC Managed Care – PPO | Admitting: Vascular Surgery

## 2019-05-27 ENCOUNTER — Other Ambulatory Visit: Payer: Self-pay

## 2019-05-27 ENCOUNTER — Encounter (INDEPENDENT_AMBULATORY_CARE_PROVIDER_SITE_OTHER): Payer: Self-pay | Admitting: Vascular Surgery

## 2019-05-27 VITALS — BP 117/65 | HR 66 | Resp 12 | Ht 71.0 in | Wt 226.0 lb

## 2019-05-27 DIAGNOSIS — I872 Venous insufficiency (chronic) (peripheral): Secondary | ICD-10-CM

## 2019-05-27 DIAGNOSIS — I890 Lymphedema, not elsewhere classified: Secondary | ICD-10-CM

## 2019-05-27 NOTE — Progress Notes (Signed)
MRN : [id number]  David Lynn is a 59 y.o. (10/28/1960) male who presents with chief complaint of  Chief Complaint  Patient presents with  . Follow-up    1 year F/u   .  History of Present Illness:   The patient returns to the office for followup evaluation regarding leg swelling. He state no major changes since last year.    Since his last visit he purchased a [employer] device and that has been a tremendous help for him putting on his stockings.  He is very pleased with that.  The swelling has persisted but with the lymph pump is much, much better controlled. The pain associated with swelling is essentially eliminated. There have not been any interval development of a ulcerations or wounds.  The patient denies problems with the pump, noting it is working well and the leggings are in good condition.  Since the previous visit the patient has been wearing graduated compression stockings and using the lymph pump on a routine basis and has noted significant improvement in the lymphedema.   Patient stated the lymph pump has been a very positive factor in her care.  Current Meds  Medication Sig  . lamoTRIgine (LAMICTAL) 200 MG tablet Take by mouth.    Past Medical History:  Diagnosis Date  . Epilepsy ([place])   . Seizure disorder [facility])     Past Surgical History:  Procedure Laterality Date  . COLONOSCOPY WITH PROPOFOL N/A 01/02/2018   Procedure: COLONOSCOPY WITH PROPOFOL;  Surgeon: Toledo, Benay Pike, MD;  Location: [facility] ENDOSCOPY;  Service: Gastroenterology;  Laterality: N/A;  . HYDROCELE EXCISION      Social History Social History   Tobacco Use  . Smoking status: Current Every Day Smoker    Packs/day: 1.00    Types: Cigarettes  . Smokeless tobacco: Never Used  Substance Use Topics  . Alcohol use: No  . Drug use: No    Family History Family History  Problem Relation Age of Onset  . Heart attack Father     Allergies  Allergen Reactions  . Iodine Rash  .  Penicillins Rash     REVIEW OF SYSTEMS (Negative unless checked)  Constitutional: [] Weight loss  [] Fever  [] Chills Cardiac: [] Chest pain   [] Chest pressure   [] Palpitations   [] Shortness of breath when laying flat   [] Shortness of breath with exertion. Vascular:  [] Pain in legs with walking   [x] Pain in legs at rest  [] History of DVT   [] Phlebitis   [x] Swelling in legs   [x] Varicose veins   [] Non-healing ulcers Pulmonary:   [] Uses home oxygen   [] Productive cough   [] Hemoptysis   [] Wheeze  [] COPD   [] Asthma Neurologic:  [] Dizziness   [] Seizures   [] History of stroke   [] History of TIA  [] Aphasia   [] Vissual changes   [] Weakness or numbness in arm   [] Weakness or numbness in leg Musculoskeletal:   [] Joint swelling   [] Joint pain   [] Low back pain Hematologic:  [] Easy bruising  [] Easy bleeding   [] Hypercoagulable state   [] Anemic Gastrointestinal:  [] Diarrhea   [] Vomiting  [] Gastroesophageal reflux/heartburn   [] Difficulty swallowing. Genitourinary:  [] Chronic kidney disease   [] Difficult urination  [] Frequent urination   [] Blood in urine Skin:  [] Rashes   [] Ulcers  Psychological:  [] History of anxiety   []  History of major depression.  Physical Examination  Vitals:   05/27/19 0831  BP: 117/65  Pulse: 66  Resp: 12  Weight: 226 lb (102.5 kg)  Height: 5\' 11"  (1.803 m)   Body mass index is 31.52 kg/m. Gen: WD/WN, NAD Head: [place]/AT, No temporalis wasting.  Ear/Nose/Throat: Hearing grossly intact, nares w/o erythema or drainage Eyes: PER, EOMI, sclera nonicteric.  Neck: Supple, no large masses.   Pulmonary:  Good air movement, no audible wheezing bilaterally, no use of accessory muscles.  Cardiac: RRR, no JVD Vascular: scattered varicosities present bilaterally right > than left especially in the ankle area. Moderate venous stasis changes to the legs bilaterally.  2+ soft pitting edema Vessel Right Left  PT Palpable Palpable  DP Palpable Palpable  Gastrointestinal: Non-distended. No  guarding/no peritoneal signs.  Musculoskeletal: M/S 5/5 throughout.  No deformity or atrophy.  Neurologic: CN 2-12 intact. Symmetrical.  Speech is fluent. Motor exam as listed above. Psychiatric: Judgment intact, Mood & affect appropriate for pt's clinical situation. Dermatologic: Venous rashes no ulcers noted.  No changes consistent with cellulitis. Lymph : No lichenification or skin changes of chronic lymphedema.  CBC Lab Results  Component Value Date   WBC 7.3 01/10/2018   HGB 14.7 01/10/2018   HCT 42.9 01/10/2018   MCV 91.8 01/10/2018   PLT 317 01/10/2018    BMET    Component Value Date/Time   NA 141 01/10/2018 1555   NA 137 05/14/2013 1737   K 4.0 01/10/2018 1555   K 4.3 05/14/2013 1737   CL 108 01/10/2018 1555   CL 105 05/14/2013 1737   CO2 20 (L) 01/10/2018 1555   CO2 24 05/14/2013 1737   GLUCOSE 115 (H) 01/10/2018 1555   GLUCOSE 115 (H) 05/14/2013 1737   BUN 16 01/10/2018 1555   BUN 15 05/14/2013 1737   CREATININE 1.27 (H) 01/10/2018 1555   CREATININE 0.96 05/14/2013 1737   CALCIUM 9.6 01/10/2018 1555   CALCIUM 8.1 (L) 05/14/2013 1737   GFRNONAA >60 01/10/2018 1555   GFRNONAA >60 05/14/2013 1737   GFRAA >60 01/10/2018 1555   GFRAA >60 05/14/2013 1737   CrCl cannot be calculated (Patient's most recent lab result is older than the maximum 21 days allowed.).  COAG No results found for: INR, PROTIME  Radiology No results found.    Assessment/Plan 1. Lymphedema No surgery or intervention at this point in time.   I have reviewed my discussion with the patient regarding lymphedema and why it causes symptoms. Patient will continue wearing graduated compression stockings class 1 (20-30 mmHg) on a daily basis a prescription was given. The patient is reminded to put the stockings on first thing in the morning and removing them in the evening. The patient is instructed specifically not to sleep in the stockings.   In addition, behavioral modification  throughout the day will be continued. This will include frequent elevation (such as in a recliner), use of over the counter pain medications as needed and exercise such as walking.  I have reviewed systemic causes for chronic edema such as liver, kidney and cardiac etiologies and there does not appear to be any significant changes in these organ systems over the past year. The patient is under the impression that these organ systems are all stable and unchanged.   The patient will continue aggressive use of the lymph pump. This will continue to improve the edema control and prevent sequela such as ulcers and infections.   The patient will follow-up with me on an annual basis.  2. Chronic venous insufficiency No surgery or intervention at this point in time.   I have reviewed my discussion with the patient regarding  lymphedema and why it causes symptoms. Patient will continue wearing graduated compression stockings class 1 (20-30 mmHg) on a daily basis a prescription was given. The patient is reminded to put the stockings on first thing in the morning and removing them in the evening. The patient is instructed specifically not to sleep in the stockings.   In addition, behavioral modification throughout the day will be continued. This will include frequent elevation (such as in a recliner), use of over the counter pain medications as needed and exercise such as walking.  I have reviewed systemic causes for chronic edema such as liver, kidney and cardiac etiologies and there does not appear to be any significant changes in these organ systems over the past year. The patient is under the impression that these organ systems are all stable and unchanged.   The patient will continue aggressive use of the lymph pump. This will continue to improve the edema control and prevent sequela such as ulcers and infections.   The patient will follow-up with me on an annual  basis.    Hortencia Pilar, MD  05/27/2019 8:37 AM

## 2019-11-05 ENCOUNTER — Other Ambulatory Visit: Payer: Self-pay

## 2019-11-05 ENCOUNTER — Ambulatory Visit (INDEPENDENT_AMBULATORY_CARE_PROVIDER_SITE_OTHER): Payer: BC Managed Care – PPO | Admitting: Nurse Practitioner

## 2019-11-05 ENCOUNTER — Encounter (INDEPENDENT_AMBULATORY_CARE_PROVIDER_SITE_OTHER): Payer: Self-pay | Admitting: Nurse Practitioner

## 2019-11-05 VITALS — BP 99/66 | HR 60 | Resp 16 | Wt 220.0 lb

## 2019-11-05 DIAGNOSIS — I890 Lymphedema, not elsewhere classified: Secondary | ICD-10-CM

## 2019-11-05 DIAGNOSIS — I83813 Varicose veins of bilateral lower extremities with pain: Secondary | ICD-10-CM | POA: Diagnosis not present

## 2019-11-07 ENCOUNTER — Encounter (INDEPENDENT_AMBULATORY_CARE_PROVIDER_SITE_OTHER): Payer: Self-pay | Admitting: Nurse Practitioner

## 2019-11-07 NOTE — Progress Notes (Signed)
Subjective:    Patient ID: AADI BORDNER, male    DOB: [birth year], 59 y.o.   MRN: [id number] Chief Complaint  Patient presents with  . Follow-up    58yr follow up    The patient returns to the office for followup evaluation regarding leg swelling.  The swelling has persisted but with the lymph pump is much, much better controlled. The pain associated with swelling is essentially eliminated. There have not been any interval development of a ulcerations or wounds.  The patient denies problems with the pump, noting it is working well and the leggings are in good condition.  Since the previous visit the patient has been wearing graduated compression stockings and using the lymph pump on a routine basis and  has noted significant improvement in the lymphedema.   Patient stated the lymph pump has been a very positive factor in his care.    Review of Systems  Cardiovascular: Positive for leg swelling.  All other systems reviewed and are negative.      Objective:   Physical Exam Vitals reviewed.  HENT:     Head: Normocephalic.  Cardiovascular:     Rate and Rhythm: Normal rate and regular rhythm.     Pulses: Normal pulses.  Pulmonary:     Effort: Pulmonary effort is normal.  Neurological:     Mental Status: He is alert and oriented to person, place, and time.  Psychiatric:        Mood and Affect: Mood normal.        Behavior: Behavior normal.        Thought Content: Thought content normal.        Judgment: Judgment normal.     BP 99/66 (BP Location: Right Arm)   Pulse 60   Resp 16   Wt (!) 220 lb (99.8 kg)   BMI 30.68 kg/m   Past Medical History:  Diagnosis Date  . Epilepsy ([place])   . Seizure disorder [facility])     Social History   Socioeconomic History  . Marital status: Single    Spouse name: Not on file  . Number of children: Not on file  . Years of education: Not on file  . Highest education level: Not on file  Occupational History  . Not on file  Tobacco  Use  . Smoking status: Current Every Day Smoker    Packs/day: 1.00    Types: Cigarettes  . Smokeless tobacco: Never Used  Vaping Use  . Vaping Use: Never used  Substance and Sexual Activity  . Alcohol use: No  . Drug use: No  . Sexual activity: Not on file  Other Topics Concern  . Not on file  Social History Narrative  . Not on file   Social Determinants of Health   Financial Resource Strain:   . Difficulty of Paying Living Expenses:   Food Insecurity:   . Worried About Charity fundraiser in the Last Year:   . Arboriculturist in the Last Year:   Transportation Needs:   . Film/video editor (Medical):   Marland Kitchen Lack of Transportation (Non-Medical):   Physical Activity:   . Days of Exercise per Week:   . Minutes of Exercise per Session:   Stress:   . Feeling of Stress :   Social Connections:   . Frequency of Communication with Friends and Family:   . Frequency of Social Gatherings with Friends and Family:   . Attends Religious Services:   .  Active Member of Clubs or Organizations:   . Attends Archivist Meetings:   Marland Kitchen Marital Status:   Intimate Partner Violence:   . Fear of Current or Ex-Partner:   . Emotionally Abused:   Marland Kitchen Physically Abused:   . Sexually Abused:     Past Surgical History:  Procedure Laterality Date  . COLONOSCOPY WITH PROPOFOL N/A 01/02/2018   Procedure: COLONOSCOPY WITH PROPOFOL;  Surgeon: Toledo, Benay Pike, MD;  Location: [facility] ENDOSCOPY;  Service: Gastroenterology;  Laterality: N/A;  . HYDROCELE EXCISION      Family History  Problem Relation Age of Onset  . Heart attack Father     Allergies  Allergen Reactions  . Iodine Rash  . Penicillins Rash       Assessment & Plan:   1. Lymphedema  No surgery or intervention at this point in time.    I have reviewed my discussion with the patient regarding lymphedema and why it  causes symptoms.  Patient will continue wearing graduated compression stockings class 1 (20-30 mmHg) on a  daily basis a prescription was given. The patient is reminded to put the stockings on first thing in the morning and removing them in the evening. The patient is instructed specifically not to sleep in the stockings.   In addition, behavioral modification throughout the day will be continued.  This will include frequent elevation (such as in a recliner), use of over the counter pain medications as needed and exercise such as walking.  I have reviewed systemic causes for chronic edema such as liver, kidney and cardiac etiologies and there does not appear to be any significant changes in these organ systems over the past year.  The patient is under the impression that these organ systems are all stable and unchanged.    The patient will continue aggressive use of the  lymph pump.  This will continue to improve the edema control and prevent sequela such as ulcers and infections.   The patient will follow-up with me on an annual basis.    2. Varicose veins of both lower extremities with pain Recommend:  The patient is complaining of varicose veins.    I have had a long discussion with the patient regarding  varicose veins and why they cause symptoms.  Patient will begin wearing graduated compression stockings on a daily basis, beginning first thing in the morning and removing them in the evening. The patient is instructed specifically not to sleep in the stockings.    The patient  will also begin using over-the-counter analgesics such as Motrin 600 mg po TID to help control the symptoms as needed.    In addition, behavioral modification including elevation during the day will be initiated, utilizing a recliner was recommended.  The patient is also instructed to continue exercising such as walking 4-5 times per week.  Current Outpatient Medications on File Prior to Visit  Medication Sig Dispense Refill  . lamoTRIgine (LAMICTAL) 200 MG tablet Take by mouth.    . mupirocin ointment (BACTROBAN) 2 %  Place 1 application into the nose as needed. (Patient not taking: Reported on 11/05/2019)     No current facility-administered medications on file prior to visit.    There are no Patient Instructions on file for this visit. No follow-ups on file.   Kris Hartmann, NP

## 2020-02-18 ENCOUNTER — Telehealth: Payer: Self-pay

## 2020-02-18 DIAGNOSIS — Z122 Encounter for screening for malignant neoplasm of respiratory organs: Secondary | ICD-10-CM

## 2020-02-18 DIAGNOSIS — Z87891 Personal history of nicotine dependence: Secondary | ICD-10-CM

## 2020-02-18 NOTE — Telephone Encounter (Signed)
Contacted patient today for lung CT screening clinic based on referral from Dr. Irwin Brakeman,  I spoke to patient and his mother and he is agreeable to participate.  CT scan scheduled for Mon, Nov 29 at 11:00.  Address to imaging center and phone number to [facility] given to patient's mother.  I have confirmed he has [employer] of [employer] through [place].  Patient is a current smoker who started smoking at age 60 and smokes 1 pack a day.

## 2020-02-19 NOTE — Telephone Encounter (Signed)
Current smoker, 44 pack year

## 2020-02-19 NOTE — Addendum Note (Signed)
Addended by: Lieutenant Diego on: 02/19/2020 09:31 AM   Modules accepted: Orders

## 2020-03-09 ENCOUNTER — Encounter: Payer: Self-pay | Admitting: Nurse Practitioner

## 2020-03-09 ENCOUNTER — Ambulatory Visit
Admission: RE | Admit: 2020-03-09 | Discharge: 2020-03-09 | Disposition: A | Discharge status: Home / Self Care | Payer: BC Managed Care – PPO | Source: Ambulatory Visit | Attending: Nurse Practitioner | Admitting: Nurse Practitioner

## 2020-03-09 ENCOUNTER — Inpatient Hospital Stay: Payer: BC Managed Care – PPO | Attending: Nurse Practitioner | Admitting: Nurse Practitioner

## 2020-03-09 ENCOUNTER — Other Ambulatory Visit: Payer: Self-pay

## 2020-03-09 DIAGNOSIS — Z87891 Personal history of nicotine dependence: Secondary | ICD-10-CM | POA: Diagnosis not present

## 2020-03-09 DIAGNOSIS — Z122 Encounter for screening for malignant neoplasm of respiratory organs: Secondary | ICD-10-CM | POA: Insufficient documentation

## 2020-03-09 NOTE — Progress Notes (Signed)
Virtual Visit via Video Enabled Telemedicine Note   I connected with David Lynn on 03/09/20 at 11:00 AM EST by video enabled telemedicine visit and verified that I am speaking with the correct person using two identifiers.   I discussed the limitations, risks, security and privacy concerns of performing an evaluation and management service by telemedicine and the availability of in-person appointments. I also discussed with the patient that there may be a patient responsible charge related to this service. The patient expressed understanding and agreed to proceed.   Other persons participating in the visit and their role in the encounter: Burgess Estelle, RN- checking in patient & navigation  Patient's location: [place]  Provider's location: Clinic  Chief Complaint: Low Dose CT Screening  Patient agreed to evaluation by telemedicine to discuss shared decision making for consideration of low dose CT lung cancer screening.    In accordance with CMS guidelines, patient has met eligibility criteria including age, absence of signs or symptoms of lung cancer.  Social History   Tobacco Use  . Smoking status: Current Every Day Smoker    Packs/day: 1.00    Years: 44.00    Pack years: 44.00    Types: Cigarettes  . Smokeless tobacco: Never Used  Substance Use Topics  . Alcohol use: No     A shared decision-making session was conducted prior to the performance of CT scan. This includes one or more decision aids, includes benefits and harms of screening, follow-up diagnostic testing, over-diagnosis, false positive rate, and total radiation exposure.   Counseling on the importance of adherence to annual lung cancer LDCT screening, impact of co-morbidities, and ability or willingness to undergo diagnosis and treatment is imperative for compliance of the program.   Counseling on the importance of continued smoking cessation for former smokers; the importance of smoking cessation for  current smokers, and information about tobacco cessation interventions have been given to patient including [place] and [employer] [place] programs.   Written order for lung cancer screening with LDCT has been given to the patient and any and all questions have been answered to the best of my abilities.    Yearly follow up will be coordinated by Burgess Estelle, Thoracic Navigator.  I discussed the assessment and treatment plan with the patient. The patient was provided an opportunity to ask questions and all were answered. The patient agreed with the plan and demonstrated an understanding of the instructions.   The patient was advised to call back or seek an in-person evaluation if the symptoms worsen or if the condition fails to improve as anticipated.   I provided 15 minutes of face-to-face video visit time during this encounter, and > 50% was spent counseling as documented under my assessment & plan.   Beckey Rutter, DNP, AGNP-C [place] at [facility] [phone] (clinic)

## 2020-03-11 ENCOUNTER — Encounter: Payer: Self-pay | Admitting: *Deleted

## 2020-05-18 ENCOUNTER — Ambulatory Visit: Payer: BC Managed Care – PPO | Admitting: Podiatry

## 2020-05-18 ENCOUNTER — Other Ambulatory Visit: Payer: Self-pay

## 2020-05-18 ENCOUNTER — Encounter: Payer: Self-pay | Admitting: Podiatry

## 2020-05-18 DIAGNOSIS — S82832A Other fracture of upper and lower end of left fibula, initial encounter for closed fracture: Secondary | ICD-10-CM | POA: Diagnosis not present

## 2020-05-18 DIAGNOSIS — S93401A Sprain of unspecified ligament of right ankle, initial encounter: Secondary | ICD-10-CM | POA: Diagnosis not present

## 2020-05-18 NOTE — Patient Instructions (Addendum)
Avoid heat on the injury. Put ice on the ankle 15 minutes every hour. Keep the legs elevated when not on them. Wear the boot when up and walking  Contact your doctor at [facility] and have them send a CD with your x-rays and a report to my office here in [place] .

## 2020-05-18 NOTE — Progress Notes (Signed)
  Subjective:  Patient ID: David Lynn, male    DOB: April 27, 1960,  MRN: [id number]  Chief Complaint  Patient presents with  . Ankle Injury    Patient presents today for injury to bilat ankles/feet.  He say a buggie cart machine ran over his feet and caused him to fall and twist ankles 5 days ago.  He was seen by [facility] walk in clinic and dx with fracture left.  He still has swelling, bilat ankles, pain and real sore.    60 y.o. male presents with the above complaint. History confirmed with patient.   Objective:  Physical Exam: warm, good capillary refill, no trophic changes or ulcerative lesions, normal DP and PT pulses, normal sensory exam and venous stasis dermatitis noted.  Bilateral lateral ankles he has moderate ecchymosis.  He has pain over the distal fibula.  No pain of the proximal fibula or syndesmosis.  No medial pain.  No pain at the navicular or fifth metatarsal base.  He has good range of motion both active and passive of the ankles.  Has been able to bear weight in the CAM boot. Assessment:   1. Closed fracture of distal end of left fibula, unspecified fracture morphology, initial encounter   2. Severe sprain of right ankle, initial encounter      Plan:  Patient was evaluated and treated and all questions answered.  Unfortunately does not have a copy of his x-rays that were taken at [facility] with him.  Does not have a copy of the report either.  I am unable to see imaging results for this.  He will have the CD sent to my office for review.  Suspect likely he either has close distal fibular fracture with minimal displacement given his ability to bear weight good range of motion.  I have low suspicion for con commitment injury such as deltoid or foot fracture given his clinical findings.  On his right side and he likely has a severe contusion.  Good stability in both ankles.  He has been able to weight-bear without pain on the right side in regular shoes, with minimal  pain on the left ankle in the CAM boot.  Advised him to wear the boot at all times when walking and use crutches if he is still having pain.  Advised to use ice and elevate, avoid heat.  I think he would likely need a least 4 weeks to recover from this.  I filled out medical leave paperwork for him.  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  I would like to see him back in 3 weeks for reevaluation, in the interim he will get me the CD with his x-rays to evaluate.  Return in about 3 weeks (around 06/08/2020).

## 2020-05-25 ENCOUNTER — Telehealth (INDEPENDENT_AMBULATORY_CARE_PROVIDER_SITE_OTHER): Payer: Self-pay | Admitting: Vascular Surgery

## 2020-05-25 NOTE — Telephone Encounter (Signed)
Mother of patient called stating that he has a worker's comp case open ans she needs his patient ID #. Patient was last seen 11-05-19 67yr f/u no studies. Please advise.

## 2020-05-25 NOTE — Telephone Encounter (Signed)
Patient MRN # was giving to patient mother

## 2020-06-01 ENCOUNTER — Ambulatory Visit (INDEPENDENT_AMBULATORY_CARE_PROVIDER_SITE_OTHER): Payer: BC Managed Care – PPO | Admitting: Vascular Surgery

## 2020-06-15 ENCOUNTER — Ambulatory Visit: Payer: BC Managed Care – PPO | Admitting: Podiatry

## 2020-07-16 ENCOUNTER — Other Ambulatory Visit: Payer: Self-pay | Admitting: "Endocrinology

## 2020-07-16 DIAGNOSIS — R748 Abnormal levels of other serum enzymes: Secondary | ICD-10-CM

## 2020-07-23 ENCOUNTER — Other Ambulatory Visit: Payer: BC Managed Care – PPO

## 2020-07-23 ENCOUNTER — Inpatient Hospital Stay: Admission: RE | Admit: 2020-07-23 | Payer: BC Managed Care – PPO | Source: Ambulatory Visit

## 2020-07-27 ENCOUNTER — Encounter
Admission: RE | Admit: 2020-07-27 | Discharge: 2020-07-27 | Disposition: A | Discharge status: Home / Self Care | Payer: BC Managed Care – PPO | Source: Ambulatory Visit | Attending: "Endocrinology | Admitting: "Endocrinology

## 2020-07-27 ENCOUNTER — Other Ambulatory Visit: Payer: Self-pay

## 2020-07-27 ENCOUNTER — Ambulatory Visit
Admission: RE | Admit: 2020-07-27 | Discharge: 2020-07-27 | Disposition: A | Discharge status: Home / Self Care | Payer: BC Managed Care – PPO | Source: Ambulatory Visit | Attending: "Endocrinology | Admitting: "Endocrinology

## 2020-07-27 DIAGNOSIS — R748 Abnormal levels of other serum enzymes: Secondary | ICD-10-CM | POA: Insufficient documentation

## 2020-07-27 MED ORDER — TECHNETIUM TC 99M MEDRONATE IV KIT
20.0000 | PACK | Freq: Once | INTRAVENOUS | Status: AC | PRN
  Administered 2020-07-27: 21.904 via INTRAVENOUS

## 2020-08-29 ENCOUNTER — Other Ambulatory Visit: Payer: Self-pay

## 2020-08-29 ENCOUNTER — Emergency Department: Payer: PRIVATE HEALTH INSURANCE

## 2020-08-29 ENCOUNTER — Emergency Department
Admission: EM | Admit: 2020-08-29 | Discharge: 2020-08-29 | Disposition: A | Discharge status: Home / Self Care | Payer: PRIVATE HEALTH INSURANCE | Attending: Emergency Medicine | Admitting: Emergency Medicine

## 2020-08-29 DIAGNOSIS — Z87891 Personal history of nicotine dependence: Secondary | ICD-10-CM | POA: Diagnosis not present

## 2020-08-29 DIAGNOSIS — X58XXXA Exposure to other specified factors, initial encounter: Secondary | ICD-10-CM | POA: Insufficient documentation

## 2020-08-29 DIAGNOSIS — S79911A Unspecified injury of right hip, initial encounter: Secondary | ICD-10-CM | POA: Diagnosis present

## 2020-08-29 DIAGNOSIS — S72111A Displaced fracture of greater trochanter of right femur, initial encounter for closed fracture: Secondary | ICD-10-CM | POA: Diagnosis not present

## 2020-08-29 DIAGNOSIS — Z79899 Other long term (current) drug therapy: Secondary | ICD-10-CM | POA: Insufficient documentation

## 2020-08-29 DIAGNOSIS — Y990 Civilian activity done for income or pay: Secondary | ICD-10-CM | POA: Insufficient documentation

## 2020-08-29 MED ORDER — HYDROCODONE-ACETAMINOPHEN 5-325 MG PO TABS: 1.0000 | ORAL_TABLET | Freq: Four times a day (QID) | ORAL | 0 refills | Status: AC | PRN

## 2020-08-29 MED ORDER — MELOXICAM 15 MG PO TABS: 15.0000 mg | ORAL_TABLET | Freq: Every day | ORAL | 0 refills | Status: DC

## 2020-08-29 NOTE — Consult Note (Signed)
ORTHOPAEDIC CONSULTATION  REQUESTING PHYSICIAN: Carrie Mew, MD  Chief Complaint: Right hip pain status post fall at work  HPI: Called by Sherrie George, FNP in the ER regarding David Lynn who is a 60 y.o. male presents to the ED today for pain in the right hip after a fall at [facility] [employer] where he works 3 days ago.  He gives a history of tripping over a pallet.  Patient has had persistent pain in the right hip with difficulty ambulating.  Patient was initially evaluated at an urgent care and sent to the ER once initial x-rays showed a fracture.  EMS reports the patient was able to stand and pivot.  Past Medical History:  Diagnosis Date  . Epilepsy ([place])   . Seizure disorder [facility])    Past Surgical History:  Procedure Laterality Date  . COLONOSCOPY WITH PROPOFOL N/A 01/02/2018   Procedure: COLONOSCOPY WITH PROPOFOL;  Surgeon: Toledo, Benay Pike, MD;  Location: [facility] ENDOSCOPY;  Service: Gastroenterology;  Laterality: N/A;  . HYDROCELE EXCISION     Social History   Socioeconomic History  . Marital status: Single    Spouse name: Not on file  . Number of children: Not on file  . Years of education: Not on file  . Highest education level: Not on file  Occupational History  . Not on file  Tobacco Use  . Smoking status: Former Smoker    Packs/day: 1.00    Years: 44.00    Pack years: 44.00    Types: Cigarettes    Quit date: 05/01/2020    Years since quitting: 0.3  . Smokeless tobacco: Never Used  Vaping Use  . Vaping Use: Never used  Substance and Sexual Activity  . Alcohol use: No  . Drug use: No  . Sexual activity: Not on file  Other Topics Concern  . Not on file  Social History Narrative  . Not on file   Social Determinants of Health   Financial Resource Strain: Not on file  Food Insecurity: Not on file  Transportation Needs: Not on file  Physical Activity: Not on file  Stress: Not on file  Social Connections: Not on file   Family History  Problem Relation  Age of Onset  . Heart attack Father    Allergies  Allergen Reactions  . Iodine Rash  . Penicillins Rash   Prior to Admission medications   Medication Sig Start Date End Date Taking? Authorizing Provider  HYDROcodone-acetaminophen (NORCO/VICODIN) 5-325 MG tablet Take 1 tablet by mouth every 6 (six) hours as needed for up to 3 days for severe pain. 08/29/20 09/01/20 Yes Triplett, Cari B, FNP  meloxicam (MOBIC) 15 MG tablet Take 1 tablet (15 mg total) by mouth daily. 08/29/20  Yes Triplett, Cari B, FNP  lamoTRIgine (LAMICTAL) 200 MG tablet Take by mouth. 05/30/16   [provider]  mupirocin ointment (BACTROBAN) 2 % Place 1 application into the nose as needed. Patient not taking: Reported on 11/05/2019    [provider]   DG Chest 1 View  Result Date: 08/29/2020 CLINICAL DATA:  Fall, right hip pain EXAM: CHEST  1 VIEW COMPARISON:  None. FINDINGS: Mild peribronchial thickening. Heart and mediastinal contours are within normal limits. No focal opacities or effusions. No acute bony abnormality. IMPRESSION: Mild bronchitic changes. Electronically Signed   By: Rolm Baptise M.D.   On: 08/29/2020 12:59   CT Hip Right Wo Contrast  Result Date: 08/29/2020 CLINICAL DATA:  Evaluate right hip fracture EXAM: CT OF THE  RIGHT HIP WITHOUT CONTRAST TECHNIQUE: Multidetector CT imaging of the right hip was performed according to the standard protocol. Multiplanar CT image reconstructions were also generated. COMPARISON:  X-ray 08/29/2020 FINDINGS: Bones/Joint/Cartilage Acute comminuted fracture through the greater trochanter of the proximal right femur with approximately 6 mm of posterior and superior displacement of the dominant fracture component. Fracture does not extend through the intertrochanteric region or involve the lesser trochanter. No fracture extension to the femoral neck or femoral head. Right hip joint alignment remains anatomic without dislocation. Visualized portion of the right  hemipelvis is intact. No right SI joint or pubic symphysis diastasis. Moderate arthropathy of the pubic symphysis. Ligaments Suboptimally assessed by CT. Muscles and Tendons Findings of osseous avulsion involving the right gluteus medius tendon. No muscle atrophy. No intramuscular fluid collection. Soft tissues Ill-defined hemorrhage and edema within the subcutaneous soft tissues overlying the greater trochanter. No well-defined soft tissue hematoma. No inguinal lymphadenopathy. Calcification within the prostate gland. IMPRESSION: Acute comminuted mildly displaced fracture through the right greater trochanter. Findings suggestive of osseous avulsion involving the right gluteus medius tendon insertion. Electronically Signed   By: Davina Poke D.O.   On: 08/29/2020 14:44   DG Hip Unilat W or Wo Pelvis 2-3 Views Right  Result Date: 08/29/2020 CLINICAL DATA:  Acute RIGHT hip pain following injury 3 days ago. Initial encounter. EXAM: DG HIP (WITH OR WITHOUT PELVIS) 2-3V RIGHT COMPARISON:  None. FINDINGS: A comminuted greater trochanter fracture of the RIGHT femur is noted. The fracture line does not definitely extend to the lesser trochanter. No other fracture, subluxation or dislocation identified. IMPRESSION: Comminuted RIGHT greater trochanter femur fracture without definite extension to the lesser trochanter. Consider CT as indicated. Electronically Signed   By: Margarette Canada M.D.   On: 08/29/2020 12:59    Assessment: Right comminuted fracture of the greater trochanter  Plan: I reviewed the x-rays and CT scan images of the right hip at the request of the ER.  Patient has a comminuted fracture of the right greater trochanter.  It does not extend into the femoral neck or to the lesser trochanter.  I have recommended the patient be given crutches and should be protected weightbearing.  I recommend he is toe-touch weightbearing only and follow-up in the office next week for reevaluation and repeat x-rays.   Patient is 3 days out from injury and he has been placing weight on the right hip which has not resulted in an intertrochanteric hip fracture.  I recommend the patient follow-up at [facility] here in [place] within 1 week for reevaluation and x-ray.   Thornton Park, MD    08/29/2020 3:32 PM

## 2020-08-29 NOTE — ED Provider Notes (Signed)
[facility] Emergency Department Provider Note ____________________________________________  Time seen: Approximately 1:38 PM  I have reviewed the triage vital signs and the nursing notes.   HISTORY  Chief Complaint Hip Pain    HPI David Lynn is a 60 y.o. male with history as listed below who presents to the emergency department for evaluation and treatment of right hip pain. He reports tripping over a pallet 3 days ago while at work. He went to [facility] and was advised to come to the ER for further evaluation of potential hip fracture. He was given pain medication at [facility] and currently denies pain.  Past Medical History:  Diagnosis Date  . Epilepsy ([place])   . Seizure disorder [facility])     Patient Active Problem List   Diagnosis Date Noted  . Lymphedema 11/20/2017  . Venous ulcer of ankle, right ([place]) 03/07/2017  . Chronic venous insufficiency 03/07/2017  . Varicose veins of both lower extremities with pain 03/07/2017  . 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 COLONOSCOPY WITH PROPOFOL N/A 01/02/2018   Procedure: COLONOSCOPY WITH PROPOFOL;  Surgeon: Toledo, Benay Pike, MD;  Location: [facility] ENDOSCOPY;  Service: Gastroenterology;  Laterality: N/A;  . HYDROCELE EXCISION      Prior to Admission medications   Medication Sig Start Date End Date Taking? Authorizing Provider  HYDROcodone-acetaminophen (NORCO/VICODIN) 5-325 MG tablet Take 1 tablet by mouth every 6 (six) hours as needed for up to 3 days for severe pain. 08/29/20 09/01/20 Yes Mystery Schrupp B, FNP  meloxicam (MOBIC) 15 MG tablet Take 1 tablet (15 mg total) by mouth daily. 08/29/20  Yes Zaharah Amir B, FNP  lamoTRIgine (LAMICTAL) 200 MG tablet Take by mouth. 05/30/16   [provider]  mupirocin ointment (BACTROBAN) 2 % Place 1 application into the nose as needed. Patient not taking: Reported on 11/05/2019    [provider]    Allergies Iodine and Penicillins  Family History  Problem Relation Age of Onset  . Heart attack Father     Social History Social History   Tobacco Use  . Smoking status: Former Smoker    Packs/day: 1.00    Years: 44.00    Pack years: 44.00    Types: Cigarettes    Quit date: 05/01/2020    Years since quitting: 0.3  . Smokeless tobacco: Never Used  Vaping Use  . Vaping Use: Never used  Substance Use Topics  . Alcohol use: No  . Drug use: No    Review of Systems Constitutional: Negative for fever. Cardiovascular: Negative for chest pain. Respiratory: Negative for shortness of breath. Musculoskeletal: Positive for right hip pain. Skin: Negative for open wounds or lesions  Neurological: Negative for decrease in sensation  ____________________________________________   PHYSICAL EXAM:  VITAL SIGNS: ED Triage Vitals  Enc Vitals Group     BP --      Pulse Rate 08/29/20 1217 (!) 57     Resp 08/29/20 1217 19     Temp 08/29/20 1217 98 F (36.7 C)     Temp Source 08/29/20 1217 Oral     SpO2 08/29/20 1217 96 %     Weight 08/29/20 1218 240 lb (108.9 kg)     Height 08/29/20 1218 5\' 11"  (1.803 m)     Head Circumference --      Peak Flow --      Pain Score 08/29/20 1218 8  Pain Loc --      Pain Edu? --      Excl. in [place]? --     Constitutional: Alert and oriented. Well appearing and in no acute distress. Eyes: Conjunctivae are clear without discharge or drainage Head: Atraumatic Neck: Supple Respiratory: No cough. Respirations are even and unlabored. Musculoskeletal: Pain in right hip with internal and external rotation. No obvious shortening or rotation of the right lower extremity. Neurologic:  Awake, alert, oriented.  Skin: No open wounds or lesions.  Psychiatric: Affect and behavior are appropriate.  ____________________________________________   LABS (all labs ordered are listed, but only abnormal results are displayed)  Labs Reviewed  - No data to display ____________________________________________  RADIOLOGY  X-ray of right hip shows comminuted right greater trochanter femur fracture without definite extension to the lesser trochanter.  I, Sherrie George, personally viewed and evaluated these images (plain radiographs) as part of my medical decision making, as well as reviewing the written report by the radiologist.  DG Chest 1 View  Result Date: 08/29/2020 CLINICAL DATA:  Fall, right hip pain EXAM: CHEST  1 VIEW COMPARISON:  None. FINDINGS: Mild peribronchial thickening. Heart and mediastinal contours are within normal limits. No focal opacities or effusions. No acute bony abnormality. IMPRESSION: Mild bronchitic changes. Electronically Signed   By: Rolm Baptise M.D.   On: 08/29/2020 12:59   CT Hip Right Wo Contrast  Result Date: 08/29/2020 CLINICAL DATA:  Evaluate right hip fracture EXAM: CT OF THE RIGHT HIP WITHOUT CONTRAST TECHNIQUE: Multidetector CT imaging of the right hip was performed according to the standard protocol. Multiplanar CT image reconstructions were also generated. COMPARISON:  X-ray 08/29/2020 FINDINGS: Bones/Joint/Cartilage Acute comminuted fracture through the greater trochanter of the proximal right femur with approximately 6 mm of posterior and superior displacement of the dominant fracture component. Fracture does not extend through the intertrochanteric region or involve the lesser trochanter. No fracture extension to the femoral neck or femoral head. Right hip joint alignment remains anatomic without dislocation. Visualized portion of the right hemipelvis is intact. No right SI joint or pubic symphysis diastasis. Moderate arthropathy of the pubic symphysis. Ligaments Suboptimally assessed by CT. Muscles and Tendons Findings of osseous avulsion involving the right gluteus medius tendon. No muscle atrophy. No intramuscular fluid collection. Soft tissues Ill-defined hemorrhage and edema within the  subcutaneous soft tissues overlying the greater trochanter. No well-defined soft tissue hematoma. No inguinal lymphadenopathy. Calcification within the prostate gland. IMPRESSION: Acute comminuted mildly displaced fracture through the right greater trochanter. Findings suggestive of osseous avulsion involving the right gluteus medius tendon insertion. Electronically Signed   By: Davina Poke D.O.   On: 08/29/2020 14:44   DG Hip Unilat W or Wo Pelvis 2-3 Views Right  Result Date: 08/29/2020 CLINICAL DATA:  Acute RIGHT hip pain following injury 3 days ago. Initial encounter. EXAM: DG HIP (WITH OR WITHOUT PELVIS) 2-3V RIGHT COMPARISON:  None. FINDINGS: A comminuted greater trochanter fracture of the RIGHT femur is noted. The fracture line does not definitely extend to the lesser trochanter. No other fracture, subluxation or dislocation identified. IMPRESSION: Comminuted RIGHT greater trochanter femur fracture without definite extension to the lesser trochanter. Consider CT as indicated. Electronically Signed   By: Margarette Canada M.D.   On: 08/29/2020 12:59   ____________________________________________   PROCEDURES  Procedures  ____________________________________________   INITIAL IMPRESSION / ASSESSMENT AND PLAN / ED COURSE  David Lynn is a 60 y.o. who presents to the emergency department for treatment and evaluation  of right hip pain.  See HPI for further details.  [facility] where he was advised that he does have a fracture in his hip.  No imaging was sent with the patient.  Will reimage.  Patient declines pain medication at this time.  CT scan of the right hip performed as the plain film is insufficient to fully assess the degree of fracture.  Patient aware and agrees with the plan.  CT scan does not show intertrochanteric fracture.  There is a greater trochanter fracture.  Plan will be to discuss with Dr. Mack Guise.  Per Dr. Mack Guise, may be discharged home but  needs to be toe-touch or completely nonweightbearing.  This was discussed with the patient who states that he understands.  He states that he has crutches at home after recently having an ankle fracture.  Patient instructed to follow-up with orthopedics next week.  He was also instructed to return to the emergency department for symptoms that change or worsen if unable schedule an appointment with orthopedics or primary care.  Medications - No data to display  Pertinent labs & imaging results that were available during my care of the patient were reviewed by me and considered in my medical decision making (see chart for details).   _________________________________________   FINAL CLINICAL IMPRESSION(S) / ED DIAGNOSES  Final diagnoses:  Closed displaced fracture of greater trochanter of right femur, initial encounter [facility])    ED Discharge Orders         Ordered    meloxicam (MOBIC) 15 MG tablet  Daily        08/29/20 1528    HYDROcodone-acetaminophen (NORCO/VICODIN) 5-325 MG tablet  Every 6 hours PRN        08/29/20 1528           If controlled substance prescribed during this visit, 12 month history viewed on the [place] prior to issuing an initial prescription for Schedule II or III opiod.   Victorino Dike, FNP 08/29/20 Erskine Emery, MD 08/30/20 [phone]

## 2020-08-29 NOTE — ED Triage Notes (Signed)
Tripped over a pallet at work three days ago, works at [employer]. Went today to be evaluated by urgent care today due to pain in his right hip and difficulty walking. Urgent care reported fracture on the x-ray to ems, did not send imaging. Per ems, pt able to stand and pivot.

## 2020-09-01 ENCOUNTER — Other Ambulatory Visit: Payer: Self-pay

## 2020-09-01 ENCOUNTER — Ambulatory Visit: Payer: BC Managed Care – PPO | Admitting: Dermatology

## 2020-09-01 DIAGNOSIS — L720 Epidermal cyst: Secondary | ICD-10-CM | POA: Diagnosis not present

## 2020-09-01 NOTE — Patient Instructions (Addendum)
If you have any questions or concerns for your doctor, please call our main line at [phone] and press option 4 to reach your doctor's medical assistant. If no one answers, please leave a voicemail as directed and we will return your call as soon as possible. Messages left after 4 pm will be answered the following business day.   You may also send us a message via MyChart. We typically respond to MyChart messages within 1-2 business days.  For prescription refills, please ask your pharmacy to contact our office. Our fax number is [phone].  If you have an urgent issue when the clinic is closed that cannot wait until the next business day, you can page your doctor at the number below.    Please note that while we do our best to be available for urgent issues outside of office hours, we are not available 24/7.   If you have an urgent issue and are unable to reach us, you may choose to seek medical care at your doctor's office, retail clinic, urgent care center, or emergency room.  If you have a medical emergency, please immediately call 911 or go to the emergency department.  Pager Numbers  - Dr. Kowalski: [phone]  - Dr. Moye: [phone]  - Dr. Stewart: [phone]  In the event of inclement weather, please call our main line at [phone] for an update on the status of any delays or closures.  Dermatology Medication Tips: Please keep the boxes that topical medications come in in order to help keep track of the instructions about where and how to use these. Pharmacies typically print the medication instructions only on the boxes and not directly on the medication tubes.   If your medication is too expensive, please contact our office at [phone] option 4 or send us a message through MyChart.   We are unable to tell what your co-pay for medications will be in advance as this is different depending on your insurance coverage. However, we may be able to find a substitute  medication at lower cost or fill out paperwork to get insurance to cover a needed medication.   If a prior authorization is required to get your medication covered by your insurance company, please allow us 1-2 business days to complete this process.  Drug prices often vary depending on where the prescription is filled and some pharmacies may offer cheaper prices.  The website www.goodrx.com contains coupons for medications through different pharmacies. The prices here do not account for what the cost may be with help from insurance (it may be cheaper with your insurance), but the website can give you the price if you did not use any insurance.  - You can print the associated coupon and take it with your prescription to the pharmacy.  - You may also stop by our office during regular business hours and pick up a GoodRx coupon card.  - If you need your prescription sent electronically to a different pharmacy, notify our office through [facility] MyChart or by phone at [phone] option 4.    Pre-Operative Instructions  You are scheduled for a surgical procedure at [facility]. We recommend you read the following instructions. If you have any questions or concerns, please call the office at [phone].  1. Shower and wash the entire body with soap and water the day of your surgery paying special attention to cleansing at and around the planned surgery site.  2. Avoid aspirin or aspirin containing products at least   fourteen (14) days prior to your surgical procedure and for at least one week (7 Days) after your surgical procedure. If you take aspirin on a regular basis for heart disease or history of stroke or for any other reason, we may recommend you continue taking aspirin but please notify us if you take this on a regular basis. Aspirin can cause more bleeding to occur during surgery as well as prolonged bleeding and bruising after surgery.   3. Avoid other nonsteroidal pain  medications at least one week prior to surgery and at least one week prior to your surgery. These include medications such as Ibuprofen (Motrin, Advil and Nuprin), Naprosyn, Voltaren, Relafen, etc. If medications are used for therapeutic reasons, please inform us as they can cause increased bleeding or prolonged bleeding during and bruising after surgical procedures.   4. Please advise us if you are taking any "blood thinner" medications such as Coumadin or Dipyridamole or Plavix or similar medications. These cause increased bleeding and prolonged bleeding during procedures and bruising after surgical procedures. We may have to consider discontinuing these medications briefly prior to and shortly after your surgery if safe to do so.   5. Please inform us of all medications you are currently taking. All medications that are taken regularly should be taken the day of surgery as you always do. Nevertheless, we need to be informed of what medications you are taking prior to surgery to know whether they will affect the procedure or cause any complications.   6. Please inform us of any medication allergies. Also inform us of whether you have allergies to Latex or rubber products or whether you have had any adverse reaction to Lidocaine or Epinephrine.  7. Please inform us of any prosthetic or artificial body parts such as artificial heart valve, joint replacements, etc., or similar condition that might require preoperative antibiotics.   8. We recommend avoidance of alcohol at least two weeks prior to surgery and continued avoidance for at least two weeks after surgery.   9. We recommend discontinuation of tobacco smoking at least two weeks prior to surgery and continued abstinence for at least two weeks after surgery.  10. Do not plan strenuous exercise, strenuous work or strenuous lifting for approximately four weeks after your surgery.   11. We request if you are unable to make your scheduled surgical  appointment, please call us at least a week in advance or as soon as you are aware of a problem so that we can cancel or reschedule the appointment.   12. You MAY TAKE TYLENOL (acetaminophen) for pain as it is not a blood thinner.   13. PLEASE PLAN TO BE IN TOWN FOR TWO WEEKS FOLLOWING SURGERY, THIS IS IMPORTANT SO YOU CAN BE CHECKED FOR DRESSING CHANGES, SUTURE REMOVAL AND TO MONITOR FOR POSSIBLE COMPLICATIONS.  

## 2020-09-01 NOTE — Progress Notes (Signed)
   Follow-Up Visit   Subjective  David Lynn is a 60 y.o. male who presents for the following: Lesion (On the scalp - firm nodule, hasn't drained, isn't painful).  The following portions of the chart were reviewed this encounter and updated as appropriate:   Tobacco  Allergies  Meds  Problems  Med Hx  Surg Hx  Fam Hx     Review of Systems:  No other skin or systemic complaints except as noted in HPI or Assessment and Plan.  Objective  Well appearing patient in no apparent distress; mood and affect are within normal limits.  A focused examination was performed including the scalp. Relevant physical exam findings are noted in the Assessment and Plan.  Objective  R post lat scalp: Firm SQ nodule 1.5 cm   Assessment & Plan  Epidermal inclusion cyst R post lat scalp  Benign-appearing. Discussed surgical excision if bothersome. Patient does have an iodine allergy.   Return for surgery of cyst on the scalp.  Luther Redo, CMA, am acting as scribe for Sarina Ser, MD .  Documentation: I have reviewed the above documentation for accuracy and completeness, and I agree with the above.  Sarina Ser, MD

## 2020-09-05 ENCOUNTER — Encounter: Payer: Self-pay | Admitting: Dermatology

## 2020-10-26 ENCOUNTER — Ambulatory Visit (INDEPENDENT_AMBULATORY_CARE_PROVIDER_SITE_OTHER): Payer: BC Managed Care – PPO | Admitting: Vascular Surgery

## 2020-10-27 ENCOUNTER — Ambulatory Visit (INDEPENDENT_AMBULATORY_CARE_PROVIDER_SITE_OTHER): Payer: BC Managed Care – PPO | Admitting: Dermatology

## 2020-10-27 ENCOUNTER — Other Ambulatory Visit: Payer: Self-pay

## 2020-10-27 DIAGNOSIS — D485 Neoplasm of uncertain behavior of skin: Secondary | ICD-10-CM

## 2020-10-27 DIAGNOSIS — L7211 Pilar cyst: Secondary | ICD-10-CM

## 2020-10-27 MED ORDER — MUPIROCIN 2 % EX OINT: 1.0000 "application " | TOPICAL_OINTMENT | Freq: Every day | CUTANEOUS | 0 refills | Status: AC

## 2020-10-27 NOTE — Progress Notes (Signed)
   Follow-Up Visit   Subjective  David Lynn is a 60 y.o. male who presents for the following: Cyst (Right post lat scalp - excise today).  The following portions of the chart were reviewed this encounter and updated as appropriate:   Tobacco  Allergies  Meds  Problems  Med Hx  Surg Hx  Fam Hx     Review of Systems:  No other skin or systemic complaints except as noted in HPI or Assessment and Plan.  Objective  Well appearing patient in no apparent distress; mood and affect are within normal limits.  A focused examination was performed including scalp. Relevant physical exam findings are noted in the Assessment and Plan.  Right post lat scalp 2.5 cm cystic papule   Assessment & Plan  Neoplasm of uncertain behavior of skin Right post lat scalp  Skin excision  Lesion length (cm):  2.5 Lesion width (cm):  2.5 Margin per side (cm):  0 Total excision diameter (cm):  2.5 Informed consent: discussed and consent obtained   Timeout: patient name, date of birth, surgical site, and procedure verified   Procedure prep:  Patient was prepped and draped in usual sterile fashion Prep type:  Isopropyl alcohol and povidone-iodine Anesthesia: the lesion was anesthetized in a standard fashion   Anesthetic:  1% lidocaine w/ epinephrine 1-100,000 buffered w/ 8.4% NaHCO3 Instrument used: #15 blade   Hemostasis achieved with: pressure   Hemostasis achieved with comment:  Electrocautery Outcome: patient tolerated procedure well with no complications   Post-procedure details: sterile dressing applied and wound care instructions given   Dressing type: bandage and pressure dressing (mupirocin)    Skin repair Complexity:  Complex Final length (cm):  2 Reason for type of repair: reduce tension to allow closure, reduce the risk of dehiscence, infection, and necrosis, reduce subcutaneous dead space and avoid a hematoma, allow closure of the large defect, preserve normal anatomy, preserve  normal anatomical and functional relationships and enhance both functionality and cosmetic results   Undermining: area extensively undermined   Undermining comment:  Undermining defect 2.5 cm Subcutaneous layers (deep stitches):  Suture size:  4-0 Suture type: Vicryl (polyglactin 910)   Subcutaneous suture technique: inverted dermal. Fine/surface layer approximation (top stitches):  Suture size:  3-0 Suture type: nylon   Stitches: simple running   Suture removal (days):  7 Hemostasis achieved with: suture and pressure Outcome: patient tolerated procedure well with no complications   Post-procedure details: sterile dressing applied and wound care instructions given   Dressing type: bandage and pressure dressing (mupirocin)    mupirocin ointment (BACTROBAN) 2 % Apply 1 application topically daily. With dressing changes  Specimen 1 - Surgical pathology Differential Diagnosis: Cyst vs other  Check Margins: No  Return in about 1 week (around 11/03/2020) for suture removal.  I, Ashok Cordia, CMA, am acting as scribe for Sarina Ser, MD . Documentation: I have reviewed the above documentation for accuracy and completeness, and I agree with the above.  Sarina Ser, MD

## 2020-10-27 NOTE — Patient Instructions (Signed)
Wound Care Instructions  Cleanse wound gently with soap and water once a day then pat dry with clean gauze. Apply a thing coat of Petrolatum (petroleum jelly, "Vaseline") over the wound (unless you have an allergy to this). We recommend that you use a new, sterile tube of Vaseline. Do not pick or remove scabs. Do not remove the yellow or white "healing tissue" from the base of the wound.  Cover the wound with fresh, clean, nonstick gauze and secure with paper tape. You may use Band-Aids in place of gauze and tape if the would is small enough, but would recommend trimming much of the tape off as there is often too much. Sometimes Band-Aids can irritate the skin.  You should call the office for your biopsy report after 1 week if you have not already been contacted.  If you experience any problems, such as abnormal amounts of bleeding, swelling, significant bruising, significant pain, or evidence of infection, please call the office immediately.  FOR ADULT SURGERY PATIENTS: If you need something for pain relief you may take 1 extra strength Tylenol (acetaminophen) AND 2 Ibuprofen (200mg each) together every 4 hours as needed for pain. (do not take these if you are allergic to them or if you have a reason you should not take them.) Typically, you may only need pain medication for 1 to 3 days.   If you have any questions or concerns for your doctor, please call our main line at [phone] and press option 4 to reach your doctor's medical assistant. If no one answers, please leave a voicemail as directed and we will return your call as soon as possible. Messages left after 4 pm will be answered the following business day.   You may also send us a message via MyChart. We typically respond to MyChart messages within 1-2 business days.  For prescription refills, please ask your pharmacy to contact our office. Our fax number is [phone].  If you have an urgent issue when the clinic is closed that  cannot wait until the next business day, you can page your doctor at the number below.    Please note that while we do our best to be available for urgent issues outside of office hours, we are not available 24/7.   If you have an urgent issue and are unable to reach us, you may choose to seek medical care at your doctor's office, retail clinic, urgent care center, or emergency room.  If you have a medical emergency, please immediately call 911 or go to the emergency department.  Pager Numbers  - Dr. Kowalski: [phone]  - Dr. Moye: [phone]  - Dr. Stewart: [phone]  In the event of inclement weather, please call our main line at [phone] for an update on the status of any delays or closures.  Dermatology Medication Tips: Please keep the boxes that topical medications come in in order to help keep track of the instructions about where and how to use these. Pharmacies typically print the medication instructions only on the boxes and not directly on the medication tubes.   If your medication is too expensive, please contact our office at [phone] option 4 or send us a message through MyChart.   We are unable to tell what your co-pay for medications will be in advance as this is different depending on your insurance coverage. However, we may be able to find a substitute medication at lower cost or fill out paperwork to get insurance to cover a needed   medication.   If a prior authorization is required to get your medication covered by your insurance company, please allow us 1-2 business days to complete this process.  Drug prices often vary depending on where the prescription is filled and some pharmacies may offer cheaper prices.  The website www.goodrx.com contains coupons for medications through different pharmacies. The prices here do not account for what the cost may be with help from insurance (it may be cheaper with your insurance), but the website can give you the  price if you did not use any insurance.  - You can print the associated coupon and take it with your prescription to the pharmacy.  - You may also stop by our office during regular business hours and pick up a GoodRx coupon card.  - If you need your prescription sent electronically to a different pharmacy, notify our office through [facility] MyChart or by phone at [phone] option 4.   

## 2020-10-28 ENCOUNTER — Telehealth: Payer: Self-pay

## 2020-10-28 NOTE — Telephone Encounter (Signed)
Spoke with patient regarding surgery. He is doing fine/hd  

## 2020-10-29 ENCOUNTER — Encounter: Payer: Self-pay | Admitting: Dermatology

## 2020-11-03 ENCOUNTER — Other Ambulatory Visit: Payer: Self-pay

## 2020-11-03 ENCOUNTER — Ambulatory Visit (INDEPENDENT_AMBULATORY_CARE_PROVIDER_SITE_OTHER): Payer: BC Managed Care – PPO | Admitting: Dermatology

## 2020-11-03 DIAGNOSIS — D485 Neoplasm of uncertain behavior of skin: Secondary | ICD-10-CM

## 2020-11-03 DIAGNOSIS — Z4802 Encounter for removal of sutures: Secondary | ICD-10-CM

## 2020-11-03 NOTE — Patient Instructions (Signed)

## 2020-11-03 NOTE — Progress Notes (Signed)
   Follow-Up Visit   Subjective  David Lynn is a 60 y.o. male who presents for the following: post op/suture removal (At the R post lat scalp - patient c/o some irritation at site while trying to sleep).  The following portions of the chart were reviewed this encounter and updated as appropriate:   Tobacco  Allergies  Meds  Problems  Med Hx  Surg Hx  Fam Hx     Review of Systems:  No other skin or systemic complaints except as noted in HPI or Assessment and Plan.  Objective  Well appearing patient in no apparent distress; mood and affect are within normal limits.  A focused examination was performed including the scalp. Relevant physical exam findings are noted in the Assessment and Plan.  R post lat scalp Healing excision site.   Assessment & Plan  Neoplasm of uncertain behavior of skin R post lat scalp  Encounter for Removal of Sutures - Incision site at the R post lat scalp is clean, dry and intact - Wound cleansed, sutures removed, wound cleansed and steri strips applied.  - Will contact patient with pathology results.  - Patient advised to keep steri-strips dry until they fall off. - Scars remodel for a full year. - Once steri-strips fall off, patient can apply over-the-counter silicone scar cream each night to help with scar remodeling if desired. - Patient advised to call with any concerns or if they notice any new or changing lesions.   Related Medications mupirocin ointment (BACTROBAN) 2 % Apply 1 application topically daily. With dressing changes  Return if symptoms worsen or fail to improve.  Luther Redo, CMA, am acting as scribe for Sarina Ser, MD . Documentation: I have reviewed the above documentation for accuracy and completeness, and I agree with the above.  Sarina Ser, MD

## 2020-11-04 ENCOUNTER — Encounter: Payer: Self-pay | Admitting: Dermatology

## 2020-11-09 ENCOUNTER — Other Ambulatory Visit: Payer: Self-pay

## 2020-11-09 ENCOUNTER — Encounter (INDEPENDENT_AMBULATORY_CARE_PROVIDER_SITE_OTHER): Payer: Self-pay | Admitting: Vascular Surgery

## 2020-11-09 ENCOUNTER — Ambulatory Visit (INDEPENDENT_AMBULATORY_CARE_PROVIDER_SITE_OTHER): Payer: BC Managed Care – PPO | Admitting: Vascular Surgery

## 2020-11-09 VITALS — BP 99/63 | HR 76 | Ht 71.0 in | Wt 233.0 lb

## 2020-11-09 DIAGNOSIS — I890 Lymphedema, not elsewhere classified: Secondary | ICD-10-CM

## 2020-11-09 DIAGNOSIS — I872 Venous insufficiency (chronic) (peripheral): Secondary | ICD-10-CM

## 2020-11-09 DIAGNOSIS — I712 Thoracic aortic aneurysm, without rupture: Secondary | ICD-10-CM | POA: Diagnosis not present

## 2020-11-09 DIAGNOSIS — I7121 Aneurysm of the ascending aorta, without rupture: Secondary | ICD-10-CM

## 2020-11-09 NOTE — Progress Notes (Signed)
MRN : [id number]  David Lynn is a 60 y.o. ([birth year]) male who presents with chief complaint of  Chief Complaint  Patient presents with   Follow-up    1 yr no studies  .  History of Present Illness:   The patient returns to the office for followup evaluation regarding leg swelling.  The swelling has persisted but with the lymph pump is much, much better controlled. The pain associated with swelling is essentially eliminated. There have not been any interval development of a ulcerations or wounds.  The right ankle remains well healed and intact.  Interval identification of a TAA 4.4 cm found incidentally on CT for cancer screening.  The patient denies problems with the pump, noting it is working well and the leggings are in good condition.  Since the previous visit the patient has been wearing graduated compression stockings and using the lymph pump on a routine basis and  has noted significant improvement in the lymphedema.   Patient stated the lymph pump has been a very positive factor in his care.  He is also very happy with the [facility] device for putting his socks on.  CT scan of the chest is reviewed by me and demonstrates a 4.4 cm ascending thoracic aortic aneurysm  Current Meds  Medication Sig   lamoTRIgine (LAMICTAL) 200 MG tablet Take by mouth.   mupirocin ointment (BACTROBAN) 2 % Apply 1 application topically daily. With dressing changes    Past Medical History:  Diagnosis Date   Epilepsy ([place])    Seizure disorder [facility])     Past Surgical History:  Procedure Laterality Date   COLONOSCOPY WITH PROPOFOL N/A 01/02/2018   Procedure: COLONOSCOPY WITH PROPOFOL;  Surgeon: Toledo, Benay Pike, MD;  Location: [facility] ENDOSCOPY;  Service: Gastroenterology;  Laterality: N/A;   HYDROCELE EXCISION      Social History Social History   Tobacco Use   Smoking status: Former    Packs/day: 1.00    Years: 44.00    Pack years: 44.00    Types: Cigarettes    Quit date:  05/01/2020    Years since quitting: 0.5   Smokeless tobacco: Never  Vaping Use   Vaping Use: Never used  Substance Use Topics   Alcohol use: No   Drug use: No    Family History Family History  Problem Relation Age of Onset   Heart attack Father     Allergies  Allergen Reactions   Iodine Rash   Penicillins Rash     REVIEW OF SYSTEMS (Negative unless checked)  Constitutional: '[]'$ Weight loss  '[]'$ Fever  '[]'$ Chills Cardiac: '[]'$ Chest pain   '[]'$ Chest pressure   '[]'$ Palpitations   '[]'$ Shortness of breath when laying flat   '[]'$ Shortness of breath with exertion. Vascular:  '[]'$ Pain in legs with walking   '[]'$ Pain in legs at rest  '[]'$ History of DVT   '[]'$ Phlebitis   '[]'$ Swelling in legs   '[]'$ Varicose veins   '[]'$ Non-healing ulcers Pulmonary:   '[]'$ Uses home oxygen   '[]'$ Productive cough   '[]'$ Hemoptysis   '[]'$ Wheeze  '[]'$ COPD   '[]'$ Asthma Neurologic:  '[]'$ Dizziness   '[]'$ Seizures   '[]'$ History of stroke   '[]'$ History of TIA  '[]'$ Aphasia   '[]'$ Vissual changes   '[]'$ Weakness or numbness in arm   '[]'$ Weakness or numbness in leg Musculoskeletal:   '[]'$ Joint swelling   '[]'$ Joint pain   '[]'$ Low back pain Hematologic:  '[]'$ Easy bruising  '[]'$ Easy bleeding   '[]'$ Hypercoagulable state   '[]'$ Anemic Gastrointestinal:  '[]'$ Diarrhea   '[]'$ Vomiting  '[]'$ Gastroesophageal reflux/heartburn   '[]'$   Difficulty swallowing. Genitourinary:  '[]'$ Chronic kidney disease   '[]'$ Difficult urination  '[]'$ Frequent urination   '[]'$ Blood in urine Skin:  '[]'$ Rashes   '[]'$ Ulcers  Psychological:  '[]'$ History of anxiety   '[]'$  History of major depression.  Physical Examination  Vitals:   11/09/20 1009  BP: 99/63  Pulse: 76  Weight: 233 lb (105.7 kg)  Height: '5\' 11"'$  (1.803 m)   Body mass index is 32.5 kg/m. Gen: WD/WN, NAD Head: [place]/AT, No temporalis wasting.  Ear/Nose/Throat: Hearing grossly intact, nares w/o erythema or drainage Eyes: PER, EOMI, sclera nonicteric.  Neck: Supple, no large masses.   Pulmonary:  Good air movement, no audible wheezing bilaterally, no use of accessory muscles.  Cardiac:  RRR, no JVD Vascular:  scattered varicosities present bilaterally.  Severe venous stasis changes to the legs bilaterally.  2+ soft pitting edema; right ankle remains healed and intact Vessel Right Left  Radial Palpable Palpable  PT Palpable Palpable  DP Palpable Palpable  Gastrointestinal: Non-distended. No guarding/no peritoneal signs.  Musculoskeletal: M/S 5/5 throughout.  No deformity or atrophy.  Neurologic: CN 2-12 intact. Symmetrical.  Speech is fluent. Motor exam as listed above. Psychiatric: Judgment intact, Mood & affect appropriate for pt's clinical situation. Dermatologic: Severe venous rashes no ulcers noted.  No changes consistent with cellulitis. Lymph : No lichenification or skin changes of chronic lymphedema.  CBC Lab Results  Component Value Date   WBC 7.3 01/10/2018   HGB 14.7 01/10/2018   HCT 42.9 01/10/2018   MCV 91.8 01/10/2018   PLT 317 01/10/2018    BMET    Component Value Date/Time   NA 141 01/10/2018 1555   NA 137 05/14/2013 1737   K 4.0 01/10/2018 1555   K 4.3 05/14/2013 1737   CL 108 01/10/2018 1555   CL 105 05/14/2013 1737   CO2 20 (L) 01/10/2018 1555   CO2 24 05/14/2013 1737   GLUCOSE 115 (H) 01/10/2018 1555   GLUCOSE 115 (H) 05/14/2013 1737   BUN 16 01/10/2018 1555   BUN 15 05/14/2013 1737   CREATININE 1.27 (H) 01/10/2018 1555   CREATININE 0.96 05/14/2013 1737   CALCIUM 9.6 01/10/2018 1555   CALCIUM 8.1 (L) 05/14/2013 1737   GFRNONAA >60 01/10/2018 1555   GFRNONAA >60 05/14/2013 1737   GFRAA >60 01/10/2018 1555   GFRAA >60 05/14/2013 1737   CrCl cannot be calculated (Patient's most recent lab result is older than the maximum 21 days allowed.).  COAG No results found for: INR, PROTIME  Radiology No results found.   Assessment/Plan 1. Lymphedema  No surgery or intervention at this point in time.    I have reviewed my discussion with the patient regarding lymphedema and why it  causes symptoms.  Patient will continue wearing  graduated compression stockings class 1 (20-30 mmHg) on a daily basis a prescription was given. The patient is reminded to put the stockings on first thing in the morning and removing them in the evening. The patient is instructed specifically not to sleep in the stockings.   In addition, behavioral modification throughout the day will be continued.  This will include frequent elevation (such as in a recliner), use of over the counter pain medications as needed and exercise such as walking.  I have reviewed systemic causes for chronic edema such as liver, kidney and cardiac etiologies and there does not appear to be any significant changes in these organ systems over the past year.  The patient is under the impression that these organ systems are  all stable and unchanged.    The patient will continue aggressive use of the  lymph pump.  This will continue to improve the edema control and prevent sequela such as ulcers and infections.   The patient will follow-up with me on an annual basis.    2. Chronic venous insufficiency  No surgery or intervention at this point in time.    I have reviewed my discussion with the patient regarding lymphedema and why it  causes symptoms.  Patient will continue wearing graduated compression stockings class 1 (20-30 mmHg) on a daily basis a prescription was given. The patient is reminded to put the stockings on first thing in the morning and removing them in the evening. The patient is instructed specifically not to sleep in the stockings.   In addition, behavioral modification throughout the day will be continued.  This will include frequent elevation (such as in a recliner), use of over the counter pain medications as needed and exercise such as walking.  I have reviewed systemic causes for chronic edema such as liver, kidney and cardiac etiologies and there does not appear to be any significant changes in these organ systems over the past year.  The patient is  under the impression that these organ systems are all stable and unchanged.    The patient will continue aggressive use of the  lymph pump.  This will continue to improve the edema control and prevent sequela such as ulcers and infections.   The patient will follow-up with me on an annual basis.    3. Thoracic ascending aortic aneurysm ([place]) No surgery or intervention at this time. The patient has an asymptomatic abdominal aortic aneurysm that is less than 4 cm in maximal diameter.  I have discussed the natural history of ascending thoracic aortic aneurysm and the small risk of rupture for aneurysm less than 5 cm in size.  However, as these small aneurysms tend to enlarge over time, continued surveillance with CT scan is mandatory.   I have also discussed optimizing medical management with hypertension and lipid control and the importance of abstinence from tobacco.  He states he quit seven months ago!!!  The patient is also encouraged to exercise a minimum of 30 minutes 4 times a week.  Should the patient develop new onset abdominal or back pain or signs of peripheral embolization they are instructed to seek medical attention immediately and to alert the physician providing care that they have an aneurysm.  The patient voices their understanding.  The patient will return with an abdominal aortic duplex.  Given his history of a sending thoracic aneurysm as well as his extensive smoking history  - VAS US AORTA/IVC/ILIACS; Future   Hortencia Pilar, MD  11/09/2020 10:28 AM

## 2020-11-10 ENCOUNTER — Telehealth: Payer: Self-pay

## 2020-11-10 NOTE — Telephone Encounter (Signed)
Patient informed of pathology results 

## 2020-11-10 NOTE — Telephone Encounter (Signed)
-----   Message from Ralene Bathe, MD sent at 11/10/2020 12:06 PM EDT ----- Diagnosis Skin , right post lat scalp PILAR CYST  Benign cyst

## 2020-11-29 NOTE — Progress Notes (Signed)
MRN : [id number]  David Lynn is a 60 y.o. (Oct 08, 1960) male who presents with chief complaint of AAA.  History of Present Illness:   The patient returns to the office for evaluation of a possible abdominal aortic aneurysm. Patient denies abdominal pain or back pain, no other abdominal complaints. No changes suggesting embolic episodes.   The patient is also followed for venous insufficiency and  leg swelling.  The swelling has improved quite a bit and the pain associated with swelling has decreased substantially. There have not been any interval development of a ulcerations or wounds.  Since the previous visit the patient has been wearing graduated compression stockings and has noted little significant improvement in the lymphedema. The patient has been using compression routinely morning until night.  The patient also states elevation during the day and exercise is being done too.  There have been no interval changes in the patient's overall health care since his last visit.  Patient denies amaurosis fugax or TIA symptoms. There is no history of claudication or rest pain symptoms of the lower extremities. The patient denies angina or shortness of breath.   Duplex US of the aorta and iliac arteries shows an aorta that measures 2.27 cm. No Aneurysm identified.   No outpatient medications have been marked as taking for the 11/30/20 encounter (Appointment) with Delana Meyer, Dolores Lory, MD.    Past Medical History:  Diagnosis Date   Epilepsy [facility])    Seizure disorder [facility])     Past Surgical History:  Procedure Laterality Date   COLONOSCOPY WITH PROPOFOL N/A 01/02/2018   Procedure: COLONOSCOPY WITH PROPOFOL;  Surgeon: Toledo, Benay Pike, MD;  Location: [facility] ENDOSCOPY;  Service: Gastroenterology;  Laterality: N/A;   HYDROCELE EXCISION      Social History Social History   Tobacco Use   Smoking status: Former    Packs/day: 1.00    Years: 44.00    Pack years: 44.00    Types:  Cigarettes    Quit date: 05/01/2020    Years since quitting: 0.5   Smokeless tobacco: Never  Vaping Use   Vaping Use: Never used  Substance Use Topics   Alcohol use: No   Drug use: No    Family History Family History  Problem Relation Age of Onset   Heart attack Father     Allergies  Allergen Reactions   Iodine Rash   Penicillins Rash     REVIEW OF SYSTEMS (Negative unless checked)  Constitutional: '[]'$ Weight loss  '[]'$ Fever  '[]'$ Chills Cardiac: '[]'$ Chest pain   '[]'$ Chest pressure   '[]'$ Palpitations   '[]'$ Shortness of breath when laying flat   '[]'$ Shortness of breath with exertion. Vascular:  '[]'$ Pain in legs with walking   '[]'$ Pain in legs at rest  '[]'$ History of DVT   '[]'$ Phlebitis   '[x]'$ Swelling in legs   '[x]'$ Varicose veins   '[]'$ Non-healing ulcers Pulmonary:   '[]'$ Uses home oxygen   '[]'$ Productive cough   '[]'$ Hemoptysis   '[]'$ Wheeze  '[]'$ COPD   '[]'$ Asthma Neurologic:  '[]'$ Dizziness   '[]'$ Seizures   '[]'$ History of stroke   '[]'$ History of TIA  '[]'$ Aphasia   '[]'$ Vissual changes   '[]'$ Weakness or numbness in arm   '[]'$ Weakness or numbness in leg Musculoskeletal:   '[]'$ Joint swelling   '[]'$ Joint pain   '[]'$ Low back pain Hematologic:  '[]'$ Easy bruising  '[]'$ Easy bleeding   '[]'$ Hypercoagulable state   '[]'$ Anemic Gastrointestinal:  '[]'$ Diarrhea   '[]'$ Vomiting  '[]'$ Gastroesophageal reflux/heartburn   '[]'$ Difficulty swallowing. Genitourinary:  '[]'$ Chronic kidney disease   '[]'$ Difficult urination  '[]'$ Frequent  urination   '[]'$ Blood in urine Skin:  '[x]'$ Rashes   '[]'$ Ulcers  Psychological:  '[]'$ History of anxiety   '[]'$  History of major depression.  Physical Examination  There were no vitals filed for this visit. There is no height or weight on file to calculate BMI. Gen: WD/WN, NAD Head: [place]/AT, No temporalis wasting.  Ear/Nose/Throat: Hearing grossly intact, nares w/o erythema or drainage Eyes: PER, EOMI, sclera nonicteric.  Neck: Supple, no masses.  No bruit or JVD.  Pulmonary:  Good air movement, no audible wheezing, no use of accessory muscles.  Cardiac: RRR, normal  S1, S2, no Murmurs. Vascular:  scattered varicosities present bilaterally.  Mild venous stasis changes to the legs bilaterally.  2+ soft pitting edema  Vessel Right Left  Radial Palpable Palpable  Gastrointestinal: soft, non-distended. No guarding/no peritoneal signs.  Musculoskeletal: M/S 5/5 throughout.  No visible deformity.  Neurologic: CN 2-12 intact. Pain and light touch intact in extremities.  Symmetrical.  Speech is fluent. Motor exam as listed above. Psychiatric: Judgment intact, Mood & affect appropriate for pt's clinical situation. Dermatologic: Venous rashes no ulcers noted.  No changes consistent with cellulitis.   CBC Lab Results  Component Value Date   WBC 7.3 01/10/2018   HGB 14.7 01/10/2018   HCT 42.9 01/10/2018   MCV 91.8 01/10/2018   PLT 317 01/10/2018    BMET    Component Value Date/Time   NA 141 01/10/2018 1555   NA 137 05/14/2013 1737   K 4.0 01/10/2018 1555   K 4.3 05/14/2013 1737   CL 108 01/10/2018 1555   CL 105 05/14/2013 1737   CO2 20 (L) 01/10/2018 1555   CO2 24 05/14/2013 1737   GLUCOSE 115 (H) 01/10/2018 1555   GLUCOSE 115 (H) 05/14/2013 1737   BUN 16 01/10/2018 1555   BUN 15 05/14/2013 1737   CREATININE 1.27 (H) 01/10/2018 1555   CREATININE 0.96 05/14/2013 1737   CALCIUM 9.6 01/10/2018 1555   CALCIUM 8.1 (L) 05/14/2013 1737   GFRNONAA >60 01/10/2018 1555   GFRNONAA >60 05/14/2013 1737   GFRAA >60 01/10/2018 1555   GFRAA >60 05/14/2013 1737   CrCl cannot be calculated (Patient's most recent lab result is older than the maximum 21 days allowed.).  COAG No results found for: INR, PROTIME  Radiology No results found.   Assessment/Plan 1. Thoracic ascending aortic aneurysm ([place]) No surgery or intervention at this time.  The patient has an asymptomatic thoracic aortic aneurysm that is less than 5.0 cm in maximal diameter.  I have discussed the natural history of thoracic aortic aneurysm and the small risk of rupture for aneurysm less  than 6.0 cm in size.  However, as these small aneurysms tend to enlarge over time, continued surveillance with CT scan is mandatory.   I have also discussed optimizing medical management with hypertension and lipid control and the importance of abstinence from tobacco.  The patient is also encouraged to exercise a minimum of 30 minutes 4 times a week.   Should the patient develop new onset chest or back pain or signs of peripheral embolization they are instructed to seek medical attention immediately and to alert the physician providing care that they have an aneurysm.   The patient voices their understanding.  The patient will return in 24 months with a chest CT.   2. Chronic venous insufficiency No surgery or intervention at this point in time.    I have had a long discussion with the patient regarding venous insufficiency and why  it  causes symptoms. I have discussed with the patient the chronic skin changes that accompany venous insufficiency and the long term sequela such as infection and ulceration.  Patient will begin wearing graduated compression stockings class 1 (20-30 mmHg) or compression wraps on a daily basis a prescription was given. The patient will put the stockings on first thing in the morning and removing them in the evening. The patient is instructed specifically not to sleep in the stockings.    In addition, behavioral modification including several periods of elevation of the lower extremities during the day will be continued. I have demonstrated that proper elevation is a position with the ankles at heart level.  The patient is instructed to begin routine exercise, especially walking on a daily basis   3. Lymphedema No surgery or intervention at this point in time.    I have had a long discussion with the patient regarding venous insufficiency and why it  causes symptoms. I have discussed with the patient the chronic skin changes that accompany venous insufficiency and  the long term sequela such as infection and ulceration.  Patient will begin wearing graduated compression stockings class 1 (20-30 mmHg) or compression wraps on a daily basis a prescription was given. The patient will put the stockings on first thing in the morning and removing them in the evening. The patient is instructed specifically not to sleep in the stockings.    In addition, behavioral modification including several periods of elevation of the lower extremities during the day will be continued. I have demonstrated that proper elevation is a position with the ankles at heart level.  The patient is instructed to begin routine exercise, especially walking on a daily basis   Hortencia Pilar, MD  11/29/2020 12:44 PM

## 2020-11-30 ENCOUNTER — Encounter (INDEPENDENT_AMBULATORY_CARE_PROVIDER_SITE_OTHER): Payer: Self-pay | Admitting: Vascular Surgery

## 2020-11-30 ENCOUNTER — Other Ambulatory Visit: Payer: Self-pay

## 2020-11-30 ENCOUNTER — Ambulatory Visit (INDEPENDENT_AMBULATORY_CARE_PROVIDER_SITE_OTHER): Payer: BC Managed Care – PPO

## 2020-11-30 ENCOUNTER — Ambulatory Visit (INDEPENDENT_AMBULATORY_CARE_PROVIDER_SITE_OTHER): Payer: BC Managed Care – PPO | Admitting: Vascular Surgery

## 2020-11-30 VITALS — BP 98/60 | HR 78 | Ht 71.0 in | Wt 240.0 lb

## 2020-11-30 DIAGNOSIS — I872 Venous insufficiency (chronic) (peripheral): Secondary | ICD-10-CM

## 2020-11-30 DIAGNOSIS — I890 Lymphedema, not elsewhere classified: Secondary | ICD-10-CM | POA: Diagnosis not present

## 2020-11-30 DIAGNOSIS — I7121 Aneurysm of the ascending aorta, without rupture: Secondary | ICD-10-CM

## 2020-11-30 DIAGNOSIS — I712 Thoracic aortic aneurysm, without rupture: Secondary | ICD-10-CM

## 2021-02-12 ENCOUNTER — Emergency Department: Payer: BC Managed Care – PPO

## 2021-02-12 ENCOUNTER — Emergency Department
Admission: EM | Admit: 2021-02-12 | Discharge: 2021-02-12 | Disposition: A | Discharge status: Home / Self Care | Payer: BC Managed Care – PPO | Attending: Emergency Medicine | Admitting: Emergency Medicine

## 2021-02-12 DIAGNOSIS — Y990 Civilian activity done for income or pay: Secondary | ICD-10-CM | POA: Insufficient documentation

## 2021-02-12 DIAGNOSIS — Z87891 Personal history of nicotine dependence: Secondary | ICD-10-CM | POA: Insufficient documentation

## 2021-02-12 DIAGNOSIS — R569 Unspecified convulsions: Secondary | ICD-10-CM | POA: Diagnosis not present

## 2021-02-12 DIAGNOSIS — W010XXA Fall on same level from slipping, tripping and stumbling without subsequent striking against object, initial encounter: Secondary | ICD-10-CM | POA: Insufficient documentation

## 2021-02-12 DIAGNOSIS — S0081XA Abrasion of other part of head, initial encounter: Secondary | ICD-10-CM | POA: Diagnosis not present

## 2021-02-12 DIAGNOSIS — S0990XA Unspecified injury of head, initial encounter: Secondary | ICD-10-CM | POA: Diagnosis present

## 2021-02-12 LAB — CBC WITH DIFFERENTIAL/PLATELET
Abs Immature Granulocytes: 0.06 10*3/uL (ref 0.00–0.07)
Basophils Absolute: 0.1 10*3/uL (ref 0.0–0.1)
Basophils Relative: 1 %
Eosinophils Absolute: 0.4 10*3/uL (ref 0.0–0.5)
Eosinophils Relative: 6 %
HCT: 40.7 % (ref 39.0–52.0)
Hemoglobin: 13.4 g/dL (ref 13.0–17.0)
Immature Granulocytes: 1 %
Lymphocytes Relative: 15 %
Lymphs Abs: 0.9 10*3/uL (ref 0.7–4.0)
MCH: 30 pg (ref 26.0–34.0)
MCHC: 32.9 g/dL (ref 30.0–36.0)
MCV: 91.1 fL (ref 80.0–100.0)
Monocytes Absolute: 0.8 10*3/uL (ref 0.1–1.0)
Monocytes Relative: 14 %
Neutro Abs: 3.8 10*3/uL (ref 1.7–7.7)
Neutrophils Relative %: 63 %
Platelets: 286 10*3/uL (ref 150–400)
RBC: 4.47 MIL/uL (ref 4.22–5.81)
RDW: 14.8 % (ref 11.5–15.5)
WBC: 6 10*3/uL (ref 4.0–10.5)
nRBC: 0 % (ref 0.0–0.2)

## 2021-02-12 LAB — COMPREHENSIVE METABOLIC PANEL
ALT: 33 U/L (ref 0–44)
AST: 36 U/L (ref 15–41)
Albumin: 4 g/dL (ref 3.5–5.0)
Alkaline Phosphatase: 190 U/L — ABNORMAL HIGH (ref 38–126)
Anion gap: 8 (ref 5–15)
BUN: 15 mg/dL (ref 6–20)
CO2: 25 mmol/L (ref 22–32)
Calcium: 9.9 mg/dL (ref 8.9–10.3)
Chloride: 107 mmol/L (ref 98–111)
Creatinine, Ser: 0.98 mg/dL (ref 0.61–1.24)
GFR, Estimated: >60 mL/min (ref >60)
Glucose, Bld: 108 mg/dL — ABNORMAL HIGH (ref 70–99)
Potassium: 4 mmol/L (ref 3.5–5.1)
Sodium: 140 mmol/L (ref 135–145)
Total Bilirubin: 0.6 mg/dL (ref 0.3–1.2)
Total Protein: 7.4 g/dL (ref 6.5–8.1)

## 2021-02-12 LAB — CBG MONITORING, ED: Glucose-Capillary: 99 mg/dL (ref 70–99)

## 2021-02-12 MED ORDER — LEVETIRACETAM 750 MG PO TABS
750.0000 mg | ORAL_TABLET | Freq: Once | ORAL | Status: AC
  Administered 2021-02-12: 750 mg via ORAL
  Filled 2021-02-12: qty 1

## 2021-02-12 MED ORDER — LAMOTRIGINE 100 MG PO TABS
500.0000 mg | ORAL_TABLET | Freq: Once | ORAL | Status: AC
Start: 2021-02-12 — End: 2021-02-12
  Administered 2021-02-12: 500 mg via ORAL
  Filled 2021-02-12: qty 5

## 2021-02-12 MED ORDER — LEVETIRACETAM 750 MG PO TABS: 750.0000 mg | ORAL_TABLET | Freq: Two times a day (BID) | ORAL | 5 refills | Status: DC

## 2021-02-12 NOTE — ED Notes (Signed)
Patient transported to CT 

## 2021-02-12 NOTE — ED Triage Notes (Addendum)
Presents by EMS from job site for a witnessed fall and seizure; hx of same and atrial fibrillation; abrasion noted to back of head; injury to tongue; loss of continence during event

## 2021-02-12 NOTE — ED Provider Notes (Signed)
[facility] Emergency Department Provider Note   ____________________________________________   Event Date/Time   First MD Initiated Contact with Patient 02/12/21 2034     (approximate)  I have reviewed the triage vital signs and the nursing notes.   HISTORY  Chief Complaint Seizures    HPI David Lynn is a 60 y.o. male patient was at work where he fell and had a seizure he bit his tongue was incontinent of urine and has an abrasion on the back of his head.  He does not remember the episode.  Apparently he had a generalized tonic-clonic seizure.  He sees Dr. Marcia Brash at [facility] for seizures.  He takes Lamictal and Klonopin per the records from [place].  He reports he feels well now.         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 Authorizing Provider  levETIRAcetam (KEPPRA) 750 MG tablet Take 1 tablet (750 mg total) by mouth 2 (two) times daily. 02/12/21  Yes Nena Polio, MD  lamoTRIgine (LAMICTAL) 200 MG tablet Take by mouth. 05/30/16   [provider]  meloxicam (MOBIC) 15 MG tablet Take 1 tablet (15 mg total) by mouth daily. 08/29/20   Triplett, Johnette Abraham B, FNP  mupirocin ointment  (BACTROBAN) 2 % Place 1 application into the nose as needed.    [provider]  mupirocin ointment (BACTROBAN) 2 % Apply 1 application topically daily. With dressing changes 10/27/20   Ralene Bathe, MD    Allergies Iodine and Penicillins  Family History  Problem Relation Age of Onset   Heart attack Father     Social History Social History   Tobacco Use   Smoking status: Former    Packs/day: 1.00    Years: 44.00    Pack years: 44.00    Types: Cigarettes    Quit date: 05/01/2020    Years since quitting: 0.7   Smokeless tobacco: Never  Vaping Use   Vaping Use: Never used  Substance Use Topics   Alcohol use: No   Drug use: No    Review of Systems  Constitutional: No fever/chills Eyes: No visual changes. ENT: No sore throat. Cardiovascular: Denies chest pain. Respiratory: Denies shortness of breath. Gastrointestinal: No abdominal pain.  No nausea, no vomiting.  No diarrhea.  No constipation. Genitourinary: Negative for dysuria. Musculoskeletal: Negative for back pain. Skin: Negative for rash. Neurological: Negative for headaches, focal weakness or numbness.  ____________________________________________   PHYSICAL EXAM:  VITAL SIGNS: ED Triage Vitals  Enc Vitals Group     BP 02/12/21 2039 116/68     Pulse Rate 02/12/21 2039 80     Resp 02/12/21 2039 (!) 21     Temp 02/12/21  2039 98.1 F (36.7 C)     Temp Source 02/12/21 2039 Oral     SpO2 02/12/21 2039 94 %     Weight 02/12/21 2040 240 lb 1.3 oz (108.9 kg)     Height 02/12/21 2040 5\' 11"  (1.803 m)     Head Circumference --      Peak Flow --      Pain Score 02/12/21 2039 0     Pain Loc --      Pain Edu? --      Excl. in [place]? --     Constitutional: Awake and alert but slightly slow to respond.  He is oriented. Eyes: Conjunctivae are normal. PERRL. EOMI. Head: Atraumatic. Nose: No congestion/rhinnorhea. Mouth/Throat: Mucous membranes are moist.  Oropharynx non-erythematous.  Patient appears  to have bit his lateral left tongue.  There is no serious injury but it was bleeding. Neck: No stridor. Cardiovascular: Normal rate, regular rhythm. Grossly normal heart sounds.  Good peripheral circulation. Respiratory: Normal respiratory effort.  No retractions. Lungs CTAB. Gastrointestinal: Soft and nontender. No distention. No abdominal bruits. Musculoskeletal: No lower extremity tenderness nor edema.  Neurologic:  Normal speech and language. No gross focal neurologic deficits are appreciated.  Cranial nerves II through XII are intact although visual fields were not checked.  Cerebellar finger-nose and rapid alternating movements and hands are normal.  Motor strength is 5/5 throughout and patient does not report any numbness Skin:  Skin is warm, dry and intact. No rash noted.   ____________________________________________   LABS (all labs ordered are listed, but only abnormal results are displayed)  Labs Reviewed  COMPREHENSIVE METABOLIC PANEL - Abnormal; Notable for the following components:      Result Value   Glucose, Bld 108 (*)    Alkaline Phosphatase 190 (*)    All other components within normal limits  CBC WITH DIFFERENTIAL/PLATELET  LAMOTRIGINE LEVEL  CBG MONITORING, ED   ____________________________________________  EKG  EKG read interpreted by me shows normal sinus rhythm rate of 78 slightly left axis no acute ST-T wave changes ____________________________________________  RADIOLOGY Gertha Calkin, personally viewed and evaluated these images (plain radiographs) as part of my medical decision making, as well as reviewing the written report by the radiologist.  ED MD interpretation:    Official radiology report(s): CT Head Wo Contrast  Result Date: 02/12/2021 CLINICAL DATA:  Head trauma. EXAM: CT HEAD WITHOUT CONTRAST CT CERVICAL SPINE WITHOUT CONTRAST TECHNIQUE: Multidetector CT imaging of the head and cervical spine was performed following the standard  protocol without intravenous contrast. Multiplanar CT image reconstructions of the cervical spine were also generated. COMPARISON:  CT dated 01/10/2018. FINDINGS: CT HEAD FINDINGS Brain: The ventricles and sulci appropriate size for patient's age. The gray-white matter discrimination is preserved. There is no acute intracranial hemorrhage. No mass effect or midline shift. No extra-axial fluid collection. Vascular: No hyperdense vessel or unexpected calcification. Skull: Normal. Negative for fracture or focal lesion. Sinuses/Orbits: Partial opacification of the left maxillary sinus with air-fluid level. The remainder of the visualized paranasal sinuses and the mastoid air cells are clear. Other: None CT CERVICAL SPINE FINDINGS Alignment: No acute subluxation. Skull base and vertebrae: No acute fracture. Soft tissues and spinal canal: No prevertebral fluid or swelling. No visible canal hematoma. Disc levels:  Degenerative changes. Upper chest: Negative. Other: None IMPRESSION: 1. No acute intracranial pathology. 2. No acute/traumatic cervical spine pathology. Electronically Signed   By: Anner Crete M.D.   On: 02/12/2021 21:11  CT Cervical Spine Wo Contrast  Result Date: 02/12/2021 CLINICAL DATA:  Head trauma. EXAM: CT HEAD WITHOUT CONTRAST CT CERVICAL SPINE WITHOUT CONTRAST TECHNIQUE: Multidetector CT imaging of the head and cervical spine was performed following the standard protocol without intravenous contrast. Multiplanar CT image reconstructions of the cervical spine were also generated. COMPARISON:  CT dated 01/10/2018. FINDINGS: CT HEAD FINDINGS Brain: The ventricles and sulci appropriate size for patient's age. The gray-white matter discrimination is preserved. There is no acute intracranial hemorrhage. No mass effect or midline shift. No extra-axial fluid collection. Vascular: No hyperdense vessel or unexpected calcification. Skull: Normal. Negative for fracture or focal lesion. Sinuses/Orbits:  Partial opacification of the left maxillary sinus with air-fluid level. The remainder of the visualized paranasal sinuses and the mastoid air cells are clear. Other: None CT CERVICAL SPINE FINDINGS Alignment: No acute subluxation. Skull base and vertebrae: No acute fracture. Soft tissues and spinal canal: No prevertebral fluid or swelling. No visible canal hematoma. Disc levels:  Degenerative changes. Upper chest: Negative. Other: None IMPRESSION: 1. No acute intracranial pathology. 2. No acute/traumatic cervical spine pathology. Electronically Signed   By: Anner Crete M.D.   On: 02/12/2021 21:11    ____________________________________________   PROCEDURES  Procedure(s) performed (including Critical Care): CT of the head and neck shows no acute pathology.  There is some fluid and air-fluid level in the maxillary sinus on the left.  Patient denies any pain or tenderness there he has no fever or other complaints.  Procedures   ____________________________________________   INITIAL IMPRESSION / ASSESSMENT AND PLAN / ED COURSE ----------------------------------------- 9:52 PM on 02/12/2021 -----------------------------------------   Discussed patient with Dr. Letta Pate on-call for neurology at [facility].  She reviewed his old records with me.  We reviewed the labs etc. together.  Dr. Marcia Brash had suggested adding another medicine if there was any further problems.  We will add Keppra 750 twice daily.  She will put a note in the computer for Dr. Marcia Brash to see.  He may want to contact the patient later.  The patient will return if he has any further problems.             ____________________________________________   FINAL CLINICAL IMPRESSION(S) / ED DIAGNOSES  Final diagnoses:  Seizure [facility])     ED Discharge Orders          Ordered    levETIRAcetam (KEPPRA) 750 MG tablet  2 times daily        02/12/21 2152             Note:  This document was prepared using Dragon voice  recognition software and may include unintentional dictation errors.    Nena Polio, MD 02/12/21 2153

## 2021-02-12 NOTE — Discharge Instructions (Signed)
I spoke to Dr. Letta Pate the neurologist on-call tonight.  She wants to add Keppra 750 mg twice a day.  I have written a prescription for this for you.  Gave you the first dose of it tonight.  She will contact Dr. Marcia Brash and let him know.  He may contact you about all this in the next few days.  If you have any problems with anything please do not hesitate to return here or call Dr. Marcia Brash to schedule an earlier appointment.

## 2021-02-16 LAB — LAMOTRIGINE LEVEL: Lamotrigine Lvl: 7.8 ug/mL (ref 2.0–20.0)

## 2021-05-12 ENCOUNTER — Other Ambulatory Visit: Payer: Self-pay

## 2021-05-12 DIAGNOSIS — Z87891 Personal history of nicotine dependence: Secondary | ICD-10-CM

## 2021-05-24 ENCOUNTER — Other Ambulatory Visit: Payer: Self-pay

## 2021-05-24 ENCOUNTER — Ambulatory Visit
Admission: RE | Admit: 2021-05-24 | Discharge: 2021-05-24 | Disposition: A | Discharge status: Home / Self Care | Payer: BC Managed Care – PPO | Source: Ambulatory Visit | Attending: Acute Care | Admitting: Acute Care

## 2021-05-24 DIAGNOSIS — Z87891 Personal history of nicotine dependence: Secondary | ICD-10-CM | POA: Insufficient documentation

## 2021-06-02 ENCOUNTER — Telehealth: Payer: Self-pay | Admitting: Acute Care

## 2021-06-02 DIAGNOSIS — Z87891 Personal history of nicotine dependence: Secondary | ICD-10-CM

## 2021-06-02 NOTE — Telephone Encounter (Signed)
Contacted patient, who also had his mother on the line, to review results of LDCT. No suspicious findings noted for lung cancer.  Additional notations were emphysema and atherosclerosis which were noted on previous scan. Also noted was the thoracic aneurysm, which was mentioned as stable.  Patient is followed by vascular surgeon for aneurysm with last visit 11/2020.  Mild cardiomegaly noted and patient had reviewed findings in mychart and has placed a call today to PCP to ask if cardiologist is recommended. Acknowledged understanding of results and no further questions from patient or mother.  Results faxed to PCP and order for next annual CT placed

## 2021-07-14 ENCOUNTER — Telehealth (INDEPENDENT_AMBULATORY_CARE_PROVIDER_SITE_OTHER): Payer: Self-pay | Admitting: Vascular Surgery

## 2021-07-14 NOTE — Telephone Encounter (Signed)
LVM for pt advising that we cancelled his appt for April 10th, 2023. Dr. Delana Meyer is out of the office (leg) and also, it looks as if the appt was made my mistake. Advised pt TCB and speak with [place] about this matter.  ?

## 2021-07-15 ENCOUNTER — Other Ambulatory Visit (INDEPENDENT_AMBULATORY_CARE_PROVIDER_SITE_OTHER): Payer: Self-pay | Admitting: Vascular Surgery

## 2021-07-15 DIAGNOSIS — I714 Abdominal aortic aneurysm, without rupture, unspecified: Secondary | ICD-10-CM

## 2021-07-19 ENCOUNTER — Ambulatory Visit (INDEPENDENT_AMBULATORY_CARE_PROVIDER_SITE_OTHER): Payer: BC Managed Care – PPO | Admitting: Vascular Surgery

## 2021-07-19 ENCOUNTER — Other Ambulatory Visit (INDEPENDENT_AMBULATORY_CARE_PROVIDER_SITE_OTHER): Payer: BC Managed Care – PPO

## 2021-07-19 ENCOUNTER — Encounter (INDEPENDENT_AMBULATORY_CARE_PROVIDER_SITE_OTHER): Payer: Self-pay

## 2021-07-19 ENCOUNTER — Ambulatory Visit (INDEPENDENT_AMBULATORY_CARE_PROVIDER_SITE_OTHER): Payer: BC Managed Care – PPO

## 2021-07-19 DIAGNOSIS — I714 Abdominal aortic aneurysm, without rupture, unspecified: Secondary | ICD-10-CM | POA: Diagnosis not present

## 2021-07-22 ENCOUNTER — Telehealth: Payer: Self-pay

## 2021-07-22 NOTE — Telephone Encounter (Signed)
Patient's mother called about a sore/black spot on patient's foot. He has been to the urgent care/walk in and podiatry with little to no improvement.  ?Advised mother Dr. Nehemiah Massed has no openings today. If the area is not getting any better advised mother to take patient to urgent care again or try seeing PCP.  ?Mother will call back Monday AM to see if we have any cancellations to get him in and be seen.  ?

## 2021-07-28 ENCOUNTER — Encounter (INDEPENDENT_AMBULATORY_CARE_PROVIDER_SITE_OTHER): Payer: Self-pay | Admitting: *Deleted

## 2022-01-19 ENCOUNTER — Ambulatory Visit (LOCAL_COMMUNITY_HEALTH_CENTER): Payer: Self-pay

## 2022-01-19 DIAGNOSIS — Z23 Encounter for immunization: Secondary | ICD-10-CM

## 2022-01-19 DIAGNOSIS — Z719 Counseling, unspecified: Secondary | ICD-10-CM

## 2022-01-19 NOTE — Progress Notes (Signed)
  Are you feeling sick today? No   Have you ever received a dose of COVID-19 Vaccine? [employer], [place], [place], [place], Other) Yes  If yes, which vaccine and how many doses?   Pfizer, 3   Did you bring the vaccination record card or other documentation?  Yes   Do you have a health condition or are undergoing treatment that makes you moderately or severely immunocompromised? This would include, but not be limited to: cancer, HIV, organ transplant, immunosuppressive therapy/high-dose corticosteroids, or moderate/severe primary immunodeficiency.  No  Have you received COVID-19 vaccine before or during hematopoietic cell transplant (HCT) or CAR-T-cell therapies? No  Have you ever had an allergic reaction to: (This would include a severe allergic reaction or a reaction that caused hives, swelling, or respiratory distress, including wheezing.) A component of a COVID-19 vaccine or a previous dose of COVID-19 vaccine? No   Have you ever had an allergic reaction to another vaccine (other thanCOVID-19 vaccine) or an injectable medication? (This would include a severe allergic reaction or a reaction that caused hives, swelling, or respiratory distress, including wheezing.)   No    Do you have a history of any of the following:  Myocarditis or Pericarditis No  Dermal fillers:  No  Multisystem Inflammatory Syndrome (MIS-C or MIS-A)? No  COVID-19 disease within the past 3 months? No  Vaccinated with monkeypox vaccine in the last 4 weeks? No  Pt meets criteria from [employer] for [employer], administered and tolerated well. Verbalized understanding of VIS and NCIR copy. M.Tukker Byrns,LPN.

## 2022-02-07 ENCOUNTER — Encounter (INDEPENDENT_AMBULATORY_CARE_PROVIDER_SITE_OTHER): Payer: Self-pay

## 2022-05-24 ENCOUNTER — Ambulatory Visit: Payer: BC Managed Care – PPO

## 2022-05-24 ENCOUNTER — Ambulatory Visit
Admission: RE | Admit: 2022-05-24 | Discharge: 2022-05-24 | Disposition: A | Discharge status: Home / Self Care | Payer: BC Managed Care – PPO | Source: Ambulatory Visit | Attending: Acute Care | Admitting: Acute Care

## 2022-05-24 DIAGNOSIS — Z87891 Personal history of nicotine dependence: Secondary | ICD-10-CM

## 2022-05-25 ENCOUNTER — Other Ambulatory Visit: Payer: Self-pay

## 2022-05-25 DIAGNOSIS — Z87891 Personal history of nicotine dependence: Secondary | ICD-10-CM

## 2022-07-06 ENCOUNTER — Other Ambulatory Visit (INDEPENDENT_AMBULATORY_CARE_PROVIDER_SITE_OTHER): Payer: Self-pay | Admitting: Vascular Surgery

## 2022-07-06 DIAGNOSIS — I714 Abdominal aortic aneurysm, without rupture, unspecified: Secondary | ICD-10-CM

## 2022-07-06 DIAGNOSIS — I7121 Aneurysm of the ascending aorta, without rupture: Secondary | ICD-10-CM

## 2022-07-18 ENCOUNTER — Ambulatory Visit (INDEPENDENT_AMBULATORY_CARE_PROVIDER_SITE_OTHER): Payer: BC Managed Care – PPO | Admitting: Nurse Practitioner

## 2022-07-18 ENCOUNTER — Ambulatory Visit (INDEPENDENT_AMBULATORY_CARE_PROVIDER_SITE_OTHER): Payer: BC Managed Care – PPO

## 2022-07-18 ENCOUNTER — Encounter (INDEPENDENT_AMBULATORY_CARE_PROVIDER_SITE_OTHER): Payer: Self-pay | Admitting: Nurse Practitioner

## 2022-07-18 VITALS — BP 99/65 | HR 60 | Resp 18 | Ht 71.0 in | Wt 231.6 lb

## 2022-07-18 DIAGNOSIS — I7121 Aneurysm of the ascending aorta, without rupture: Secondary | ICD-10-CM

## 2022-07-18 DIAGNOSIS — I714 Abdominal aortic aneurysm, without rupture, unspecified: Secondary | ICD-10-CM

## 2022-07-18 NOTE — Progress Notes (Signed)
MRN : [id number]  David Lynn is a 62 y.o. ([birth year]) male who presents with chief complaint of AAA.  History of Present Illness:   The patient returns to the office for evaluation of a possible abdominal aortic aneurysm. Patient denies abdominal pain or back pain, no other abdominal complaints. No changes suggesting embolic episodes.   He has a known thoracic aortic aneurysm which is followed by his annual CT scan done for his lungs.  His latest CT scan done on 05/24/2022 showed a stable 4.5 cm thoracic aortic aneurysm  There have been no interval changes in the patient's overall health care since his last visit.  Patient denies amaurosis fugax or TIA symptoms. There is no history of claudication or rest pain symptoms of the lower extremities. The patient denies angina or shortness of breath.   Duplex US of the aorta and iliac arteries shows an aorta that measures 2.30 cm. No Aneurysm identified.   Current Meds  Medication Sig   lamoTRIgine (LAMICTAL) 200 MG tablet Take 500 mg by mouth daily.   levETIRAcetam (KEPPRA) 750 MG tablet Take 750 mg by mouth 2 (two) times daily.   [DISCONTINUED] lamoTRIgine (LAMICTAL) 200 MG tablet Take by mouth.   [DISCONTINUED] lamoTRIgine (LAMICTAL) 200 MG tablet Take 200 mg by mouth daily.   [DISCONTINUED] levETIRAcetam (KEPPRA) 750 MG tablet Take 1 tablet (750 mg total) by mouth 2 (two) times daily.    Past Medical History:  Diagnosis Date   Epilepsy    Seizure disorder     Past Surgical History:  Procedure Laterality Date   COLONOSCOPY WITH PROPOFOL N/A 01/02/2018   Procedure: COLONOSCOPY WITH PROPOFOL;  Surgeon: Toledo, Boykin Nearing, MD;  Location: [facility] ENDOSCOPY;  Service: Gastroenterology;  Laterality: N/A;   HYDROCELE EXCISION      Social History Social History   Tobacco Use   Smoking status: Former    Packs/day: 1.00    Years: 44.00    Additional pack years: 0.00    Total pack years: 44.00    Types: Cigarettes    Quit  date: 05/01/2020    Years since quitting: 2.2   Smokeless tobacco: Never  Vaping Use   Vaping Use: Never used  Substance Use Topics   Alcohol use: No   Drug use: No    Family History Family History  Problem Relation Age of Onset   Heart attack Father     Allergies  Allergen Reactions   Iodine Rash   Penicillins Rash     REVIEW OF SYSTEMS (Negative unless checked)  Constitutional: [] Weight loss  [] Fever  [] Chills Cardiac: [] Chest pain   [] Chest pressure   [] Palpitations   [] Shortness of breath when laying flat   [] Shortness of breath with exertion. Vascular:  [] Pain in legs with walking   [] Pain in legs at rest  [] History of DVT   [] Phlebitis   [] Swelling in legs   [] Varicose veins   [] Non-healing ulcers Pulmonary:   [] Uses home oxygen   [] Productive cough   [] Hemoptysis   [] Wheeze  [] COPD   [] Asthma Neurologic:  [] Dizziness   [] Seizures   [] History of stroke   [] History of TIA  [] Aphasia   [] Vissual changes   [] Weakness or numbness in arm   [] Weakness or numbness in leg Musculoskeletal:   [] Joint swelling   [] Joint pain   [] Low back pain Hematologic:  [] Easy bruising  [] Easy bleeding   [] Hypercoagulable state   [] Anemic Gastrointestinal:  [] Diarrhea   [] Vomiting  [] Gastroesophageal reflux/heartburn   []   Difficulty swallowing. Genitourinary:  [] Chronic kidney disease   [] Difficult urination  [] Frequent urination   [] Blood in urine Skin:  [] Rashes   [] Ulcers  Psychological:  [] History of anxiety   []  History of major depression.  Physical Examination  Vitals:   07/18/22 0838  BP: 99/65  Pulse: 60  Resp: 18  Weight: 231 lb 9.6 oz (105.1 kg)  Height: 5\' 11"  (1.803 m)   Body mass index is 32.3 kg/m. Gen: WD/WN, NAD Head: [place]/AT, No temporalis wasting.  Ear/Nose/Throat: Hearing grossly intact, nares w/o erythema or drainage Eyes: PER, EOMI, sclera nonicteric.  Neck: Supple, no masses.  No bruit or JVD.  Pulmonary:  Good air movement, no audible wheezing, no use of  accessory muscles.  Cardiac: RRR, normal S1, S2, no Murmurs. Vascular:   Vessel Right Left  Radial Palpable Palpable  Gastrointestinal: soft, non-distended. No guarding/no peritoneal signs.  Musculoskeletal: M/S 5/5 throughout.  No visible deformity.  Neurologic: CN 2-12 intact. Pain and light touch intact in extremities.  Symmetrical.  Speech is fluent. Motor exam as listed above. Psychiatric: Judgment intact, Mood & affect appropriate for pt's clinical situation. Dermatologic: Venous rashes no ulcers noted.  No changes consistent with cellulitis.   CBC Lab Results  Component Value Date   WBC 6.0 02/12/2021   HGB 13.4 02/12/2021   HCT 40.7 02/12/2021   MCV 91.1 02/12/2021   PLT 286 02/12/2021    BMET    Component Value Date/Time   NA 140 02/12/2021 2041   NA 137 05/14/2013 1737   K 4.0 02/12/2021 2041   K 4.3 05/14/2013 1737   CL 107 02/12/2021 2041   CL 105 05/14/2013 1737   CO2 25 02/12/2021 2041   CO2 24 05/14/2013 1737   GLUCOSE 108 (H) 02/12/2021 2041   GLUCOSE 115 (H) 05/14/2013 1737   BUN 15 02/12/2021 2041   BUN 15 05/14/2013 1737   CREATININE 0.98 02/12/2021 2041   CREATININE 0.96 05/14/2013 1737   CALCIUM 9.9 02/12/2021 2041   CALCIUM 8.1 (L) 05/14/2013 1737   GFRNONAA >60 02/12/2021 2041   GFRNONAA >60 05/14/2013 1737   GFRAA >60 01/10/2018 1555   GFRAA >60 05/14/2013 1737   CrCl cannot be calculated (Patient's most recent lab result is older than the maximum 21 days allowed.).  COAG No results found for: "INR", "PROTIME"  Radiology No results found.   Assessment/Plan 1. Thoracic ascending aortic aneurysm (HCC) No surgery or intervention at this time.  The patient has an asymptomatic thoracic aortic aneurysm that is less than 5.0 cm in maximal diameter.  I have discussed the natural history of thoracic aortic aneurysm and the small risk of rupture for aneurysm less than 6.0 cm in size.  However, as these small aneurysms tend to enlarge over time,  continued surveillance with CT scan is mandatory.   I have also discussed optimizing medical management with hypertension and lipid control and the importance of abstinence from tobacco.  The patient is also encouraged to exercise a minimum of 30 minutes 4 times a week.   Should the patient develop new onset chest or back pain or signs of peripheral embolization they are instructed to seek medical attention immediately and to alert the physician providing care that they have an aneurysm.   The patient voices their understanding.  The patient will return in 12 months to review CT   2. Abdominal aortic aneurysm (AAA) without rupture, unspecified part Previous studies show an ectatic abdominal aortic aneurysm.  Today it has  decreased in size.  We will evaluate once again on an annual basis if it continues to be reduced in size we may decrease follow-up.    Georgiana SpinnerFallon E Mireille Lacombe, NP  07/18/2022 11:04 PM

## 2022-07-20 IMAGING — CT CT CERVICAL SPINE W/O CM
4 series · 15 of 33 positions shown, 18 images · non-contrast
Comparison: CT dated 01/10/2018.

CLINICAL DATA: Head trauma.

EXAM:
CT HEAD WITHOUT CONTRAST
CT CERVICAL SPINE WITHOUT CONTRAST
TECHNIQUE: Multidetector CT imaging of the head and cervical spine was
performed following the standard protocol without intravenous
contrast. Multiplanar CT image reconstructions of the cervical spine
were also generated.

[Series 3: c spine soft · axial · 0.38mm/px · z∈[+952,+984]mm · 2 of 98 slices shown]
[im 17/98  soft-tissue]
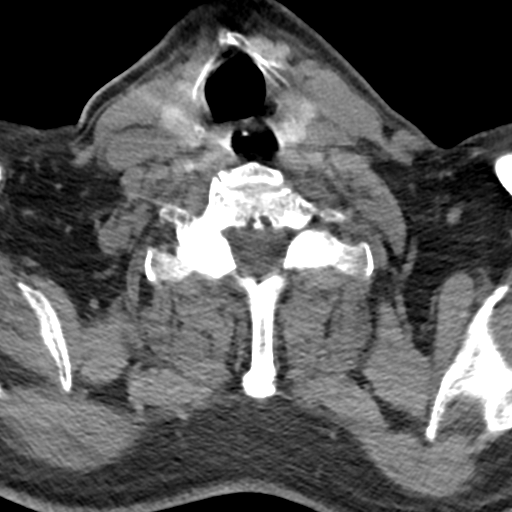
[im 33/98  soft-tissue]
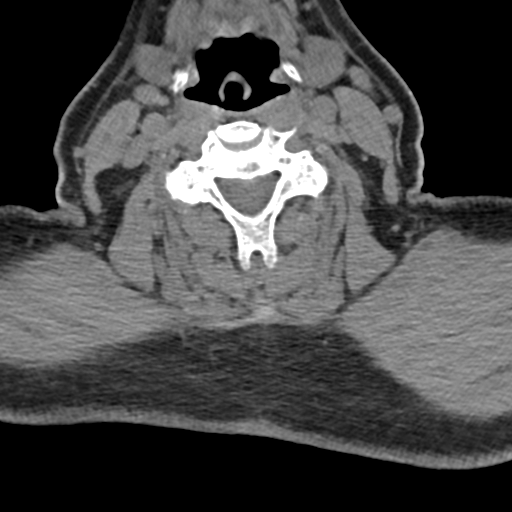

[Series 4: sagittal bone · sagittal · 0.39mm/px · 5 of 112 slices shown, 6 images]
[im 38/112  bone]
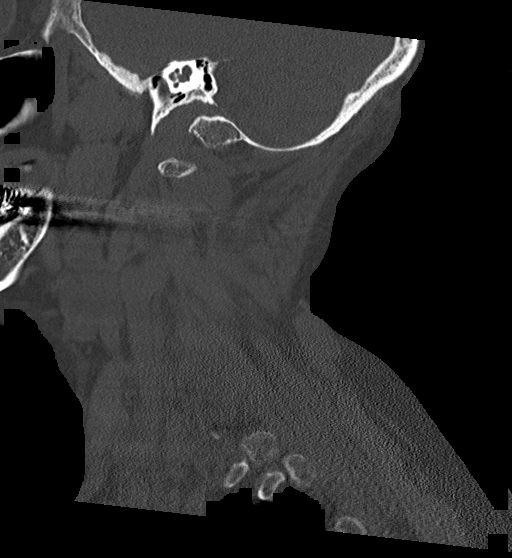
[im 47/112  bone]
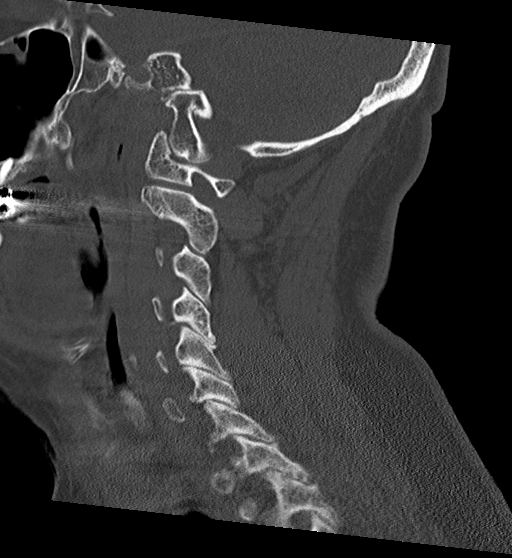
[im 56/112  soft-tissue]
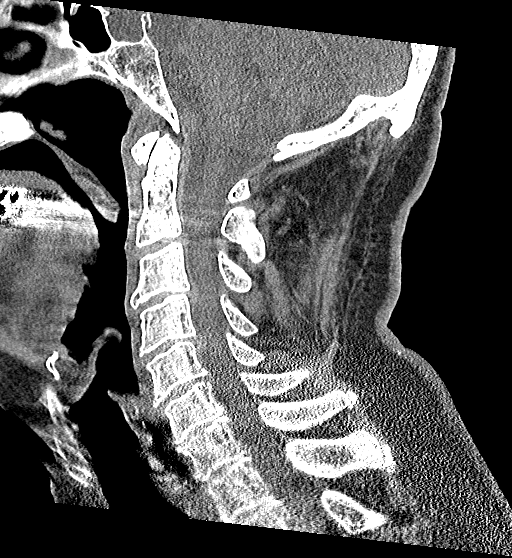
[im 56/112  bone]
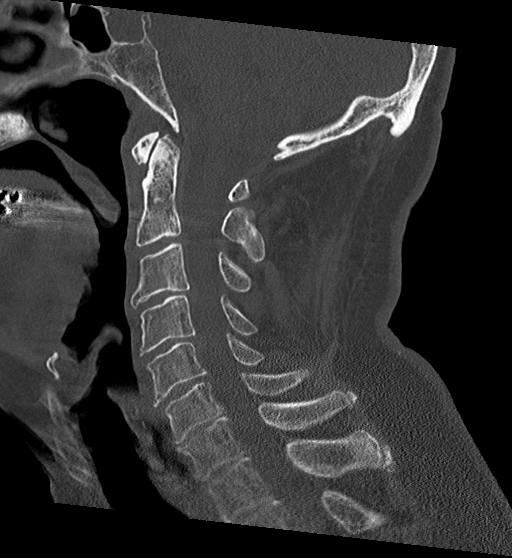
[im 65/112  bone]
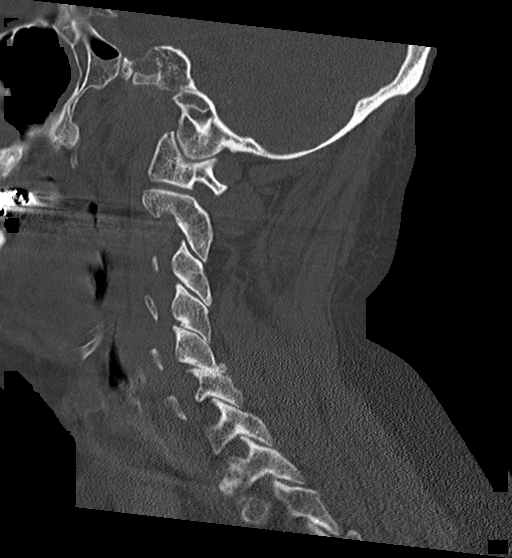
[im 75/112  bone]
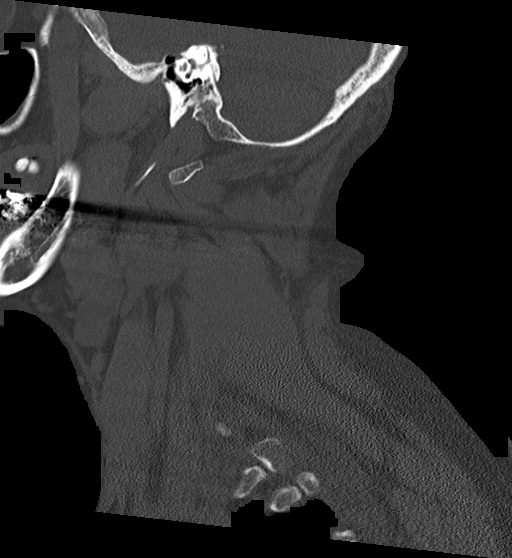

[Series 5: coronal bone · coronal · 0.43mm/px · 3 of 102 slices shown]
[im 21/102  bone]
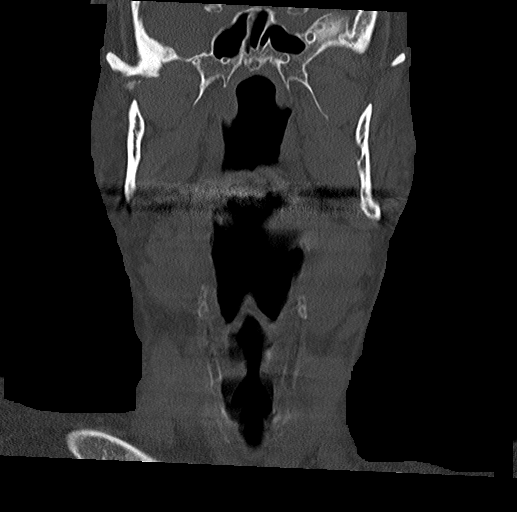
[im 41/102  bone]
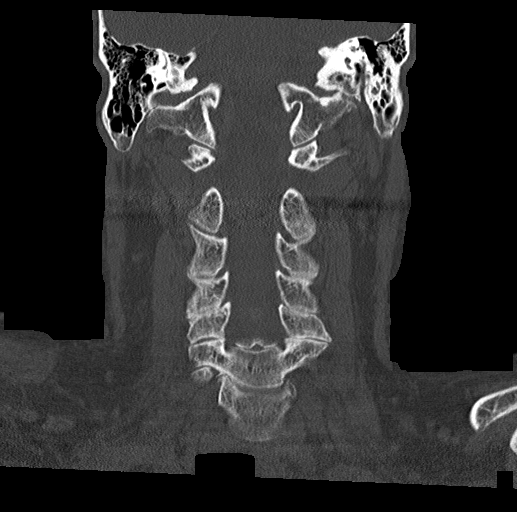
[im 61/102  bone]
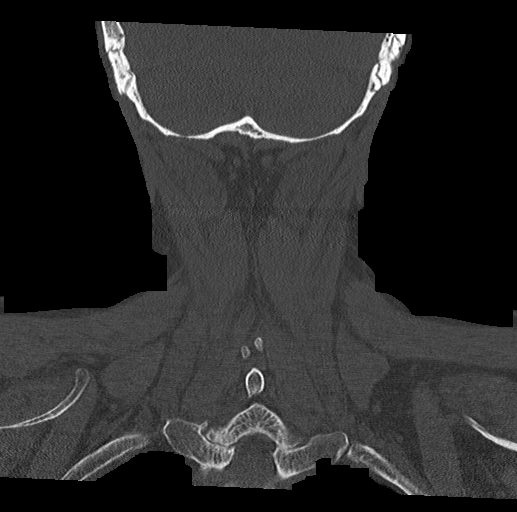

[Series 6: orthogonal bone · axial · 0.39mm/px · z∈[+925,+1070]mm · 5 of 111 slices shown, 7 images]
[im 19/111  soft-tissue]
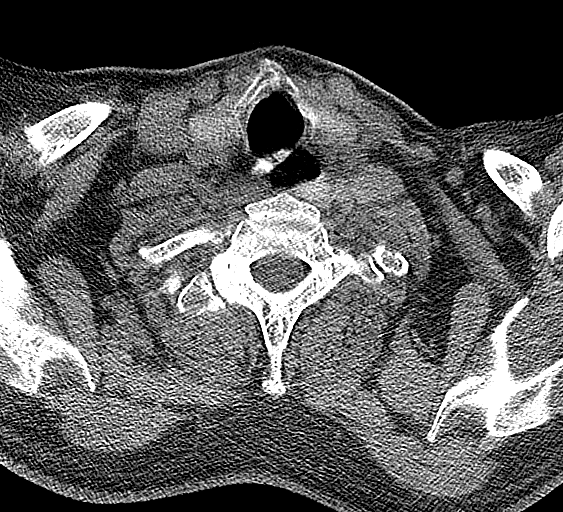
[im 19/111  bone]
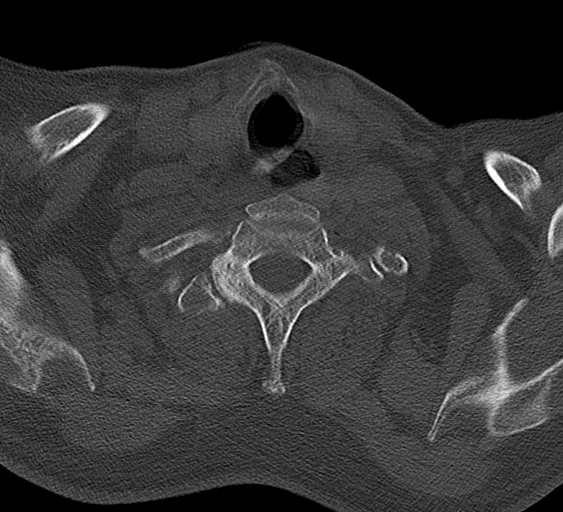
[im 37/111  bone]
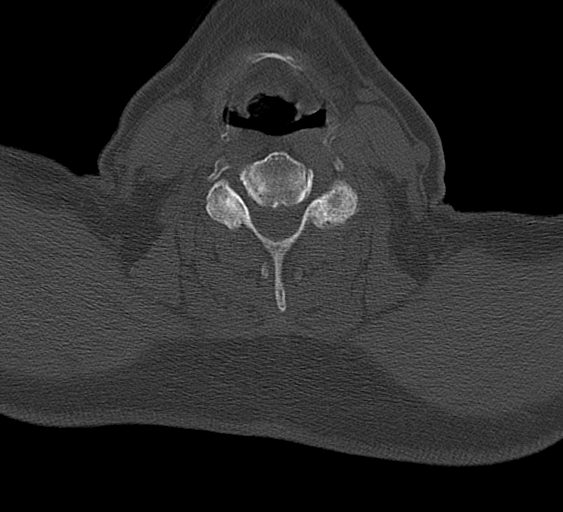
[im 56/111  bone]
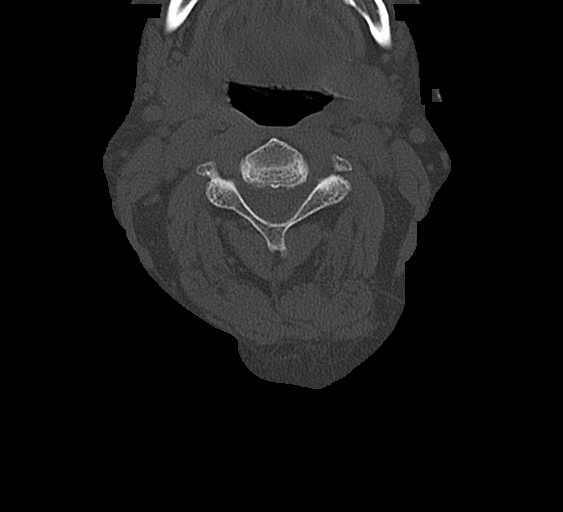
[im 74/111  bone]
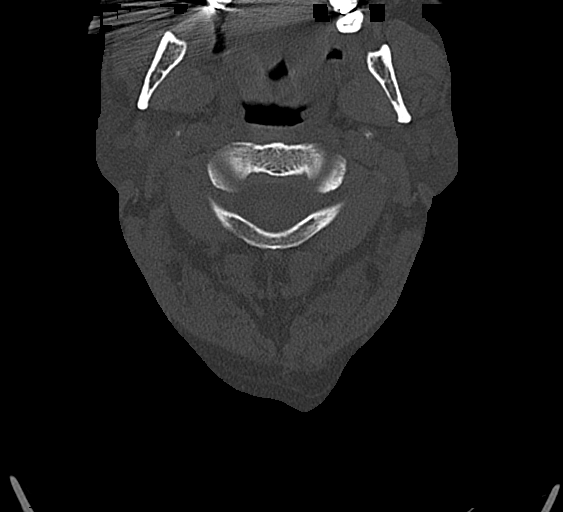
[im 92/111  soft-tissue]
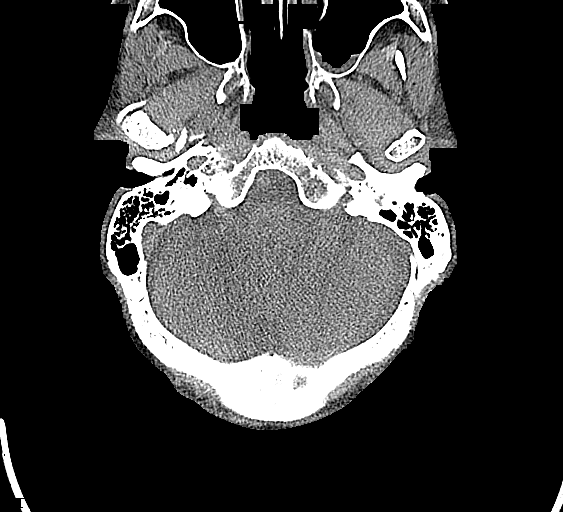
[im 92/111  bone]
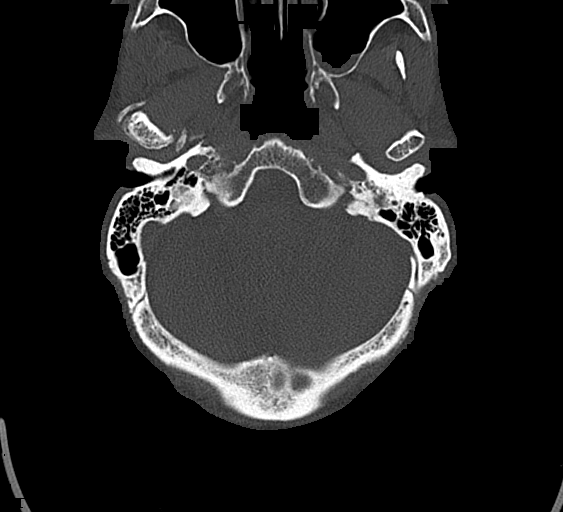

[15 of 33 positions shown; findings below may reference images not displayed]

FINDINGS: CT HEAD FINDINGS

Brain: The ventricles and sulci appropriate size for patient's age.
The gray-white matter discrimination is preserved. There is no acute
intracranial hemorrhage. No mass effect or midline shift. No
extra-axial fluid collection.

Vascular: No hyperdense vessel or unexpected calcification.

Skull: Normal. Negative for fracture or focal lesion.

Sinuses/Orbits: Partial opacification of the left maxillary sinus
with air-fluid level. The remainder of the visualized paranasal
sinuses and the mastoid air cells are clear.

Other: None

CT CERVICAL SPINE FINDINGS

Alignment: No acute subluxation.

Skull base and vertebrae: No acute fracture.

Soft tissues and spinal canal: No prevertebral fluid or swelling. No
visible canal hematoma.

Disc levels:  Degenerative changes.

Upper chest: Negative.

Other: None
IMPRESSION: 1. No acute intracranial pathology.
2. No acute/traumatic cervical spine pathology.

## 2022-07-20 IMAGING — CT CT HEAD W/O CM
3 series · 15 of 47 positions shown, 18 images · non-contrast
Comparison: CT dated 01/10/2018.

CLINICAL DATA: Head trauma.

EXAM:
CT HEAD WITHOUT CONTRAST
CT CERVICAL SPINE WITHOUT CONTRAST
TECHNIQUE: Multidetector CT imaging of the head and cervical spine was
performed following the standard protocol without intravenous
contrast. Multiplanar CT image reconstructions of the cervical spine
were also generated.

[Series 2: head wo · axial · 0.47mm/px · z∈[+1093,+1238]mm · 9 of 35 slices shown, 12 images]
[im 3/35  brain]
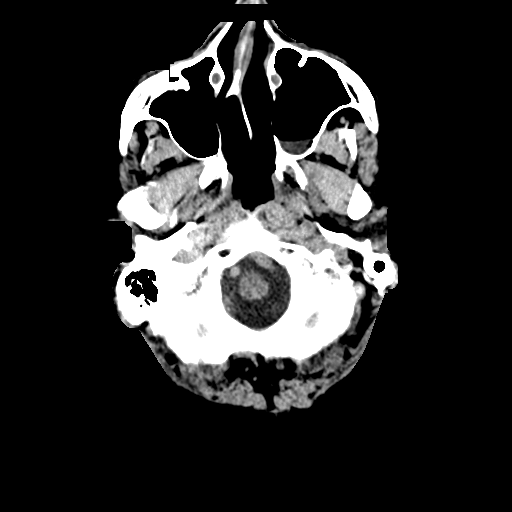
[im 3/35  bone]
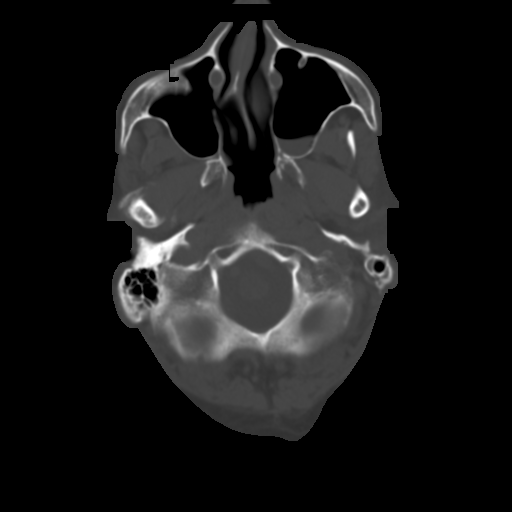
[im 6/35  brain]
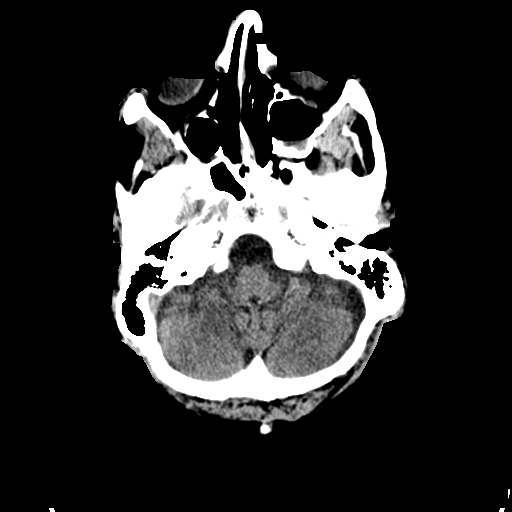
[im 10/35  brain]
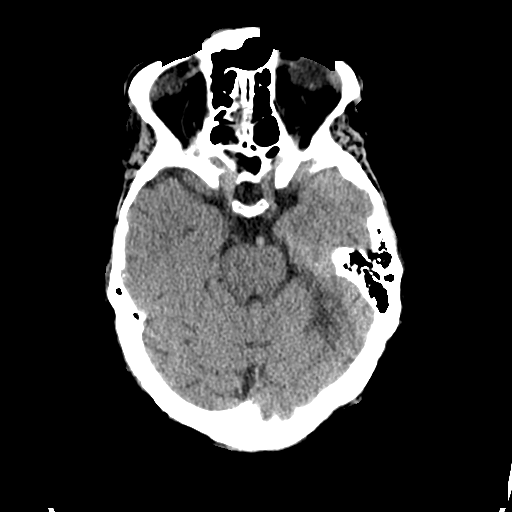
[im 13/35  brain]
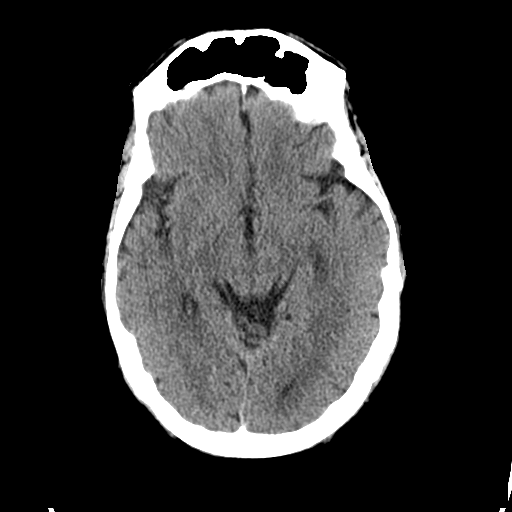
[im 18/35  brain]
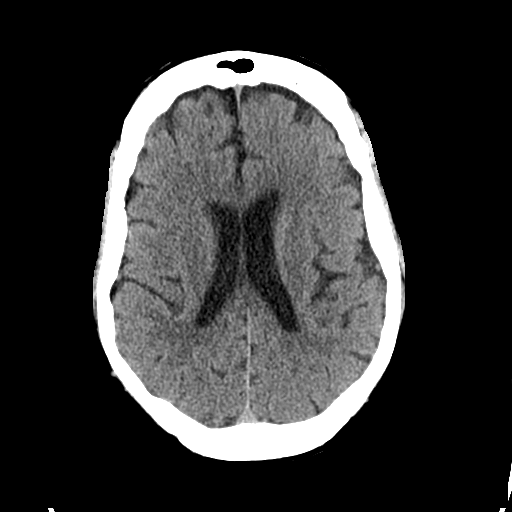
[im 18/35  bone]
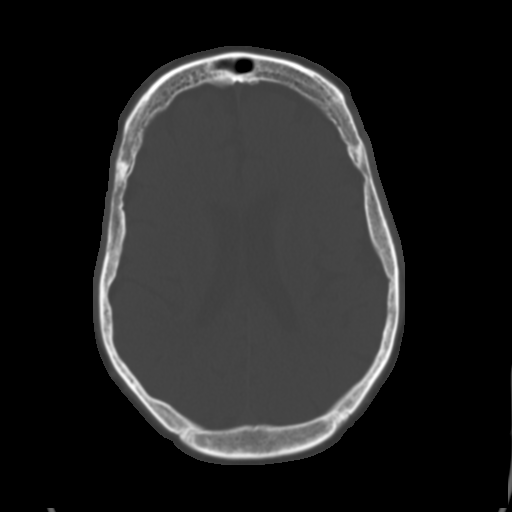
[im 22/35  brain]
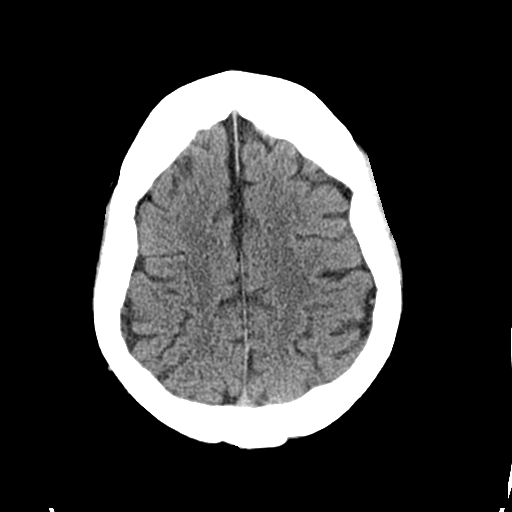
[im 25/35  brain]
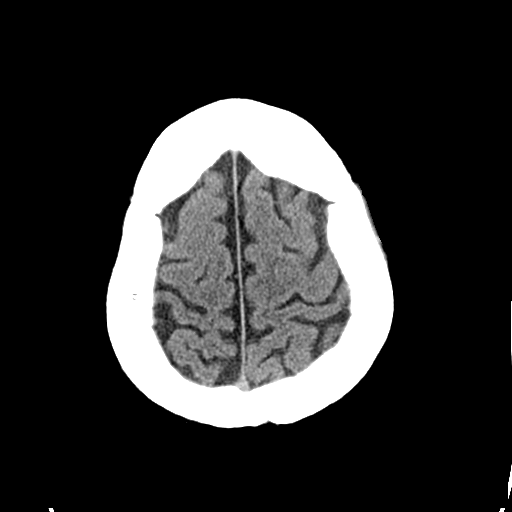
[im 29/35  brain]
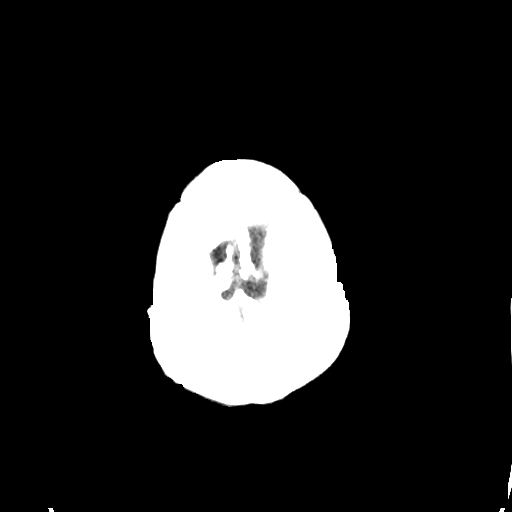
[im 32/35  brain]
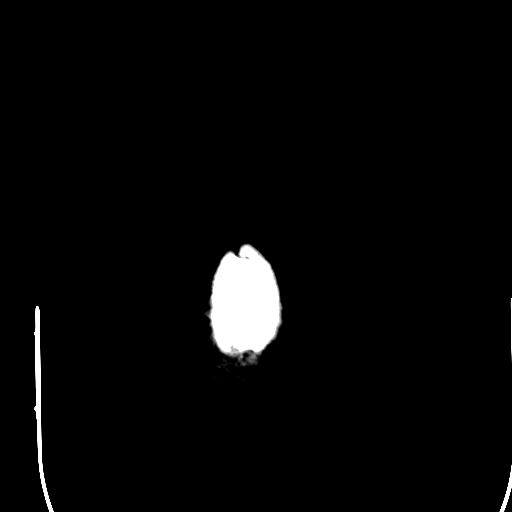
[im 32/35  bone]
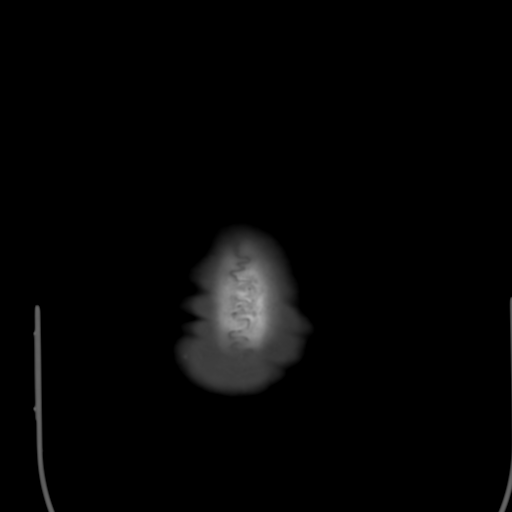

[Series 4: coronal soft tissue · coronal · 0.33mm/px · 3 of 75 slices shown]
[im 25/75  brain]
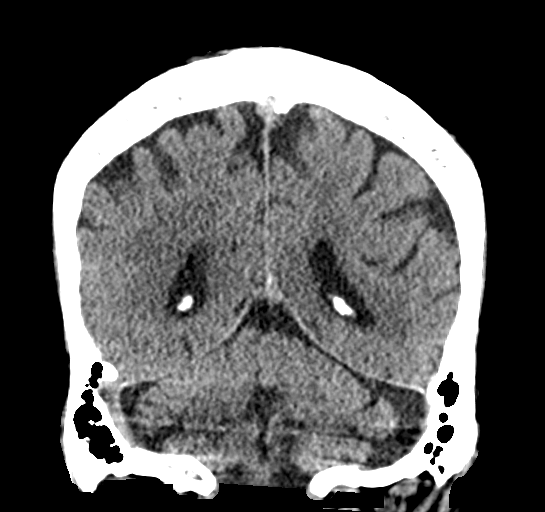
[im 33/75  brain]
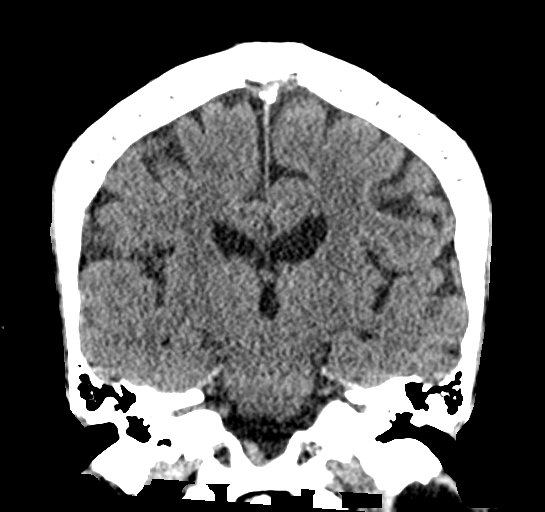
[im 42/75  brain]
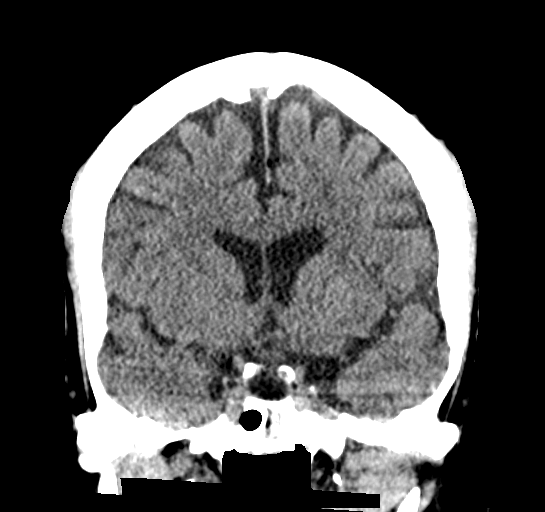

[Series 5: sagittal soft tissue · sagittal · 0.33mm/px · 3 of 61 slices shown]
[im 21/61  brain]
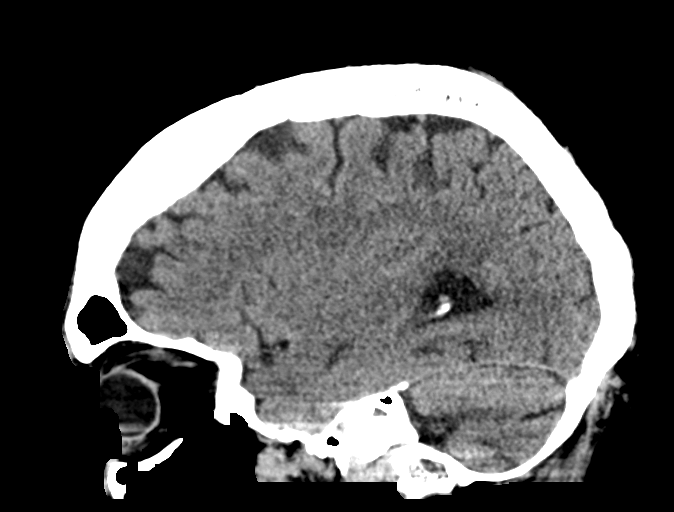
[im 31/61  brain]
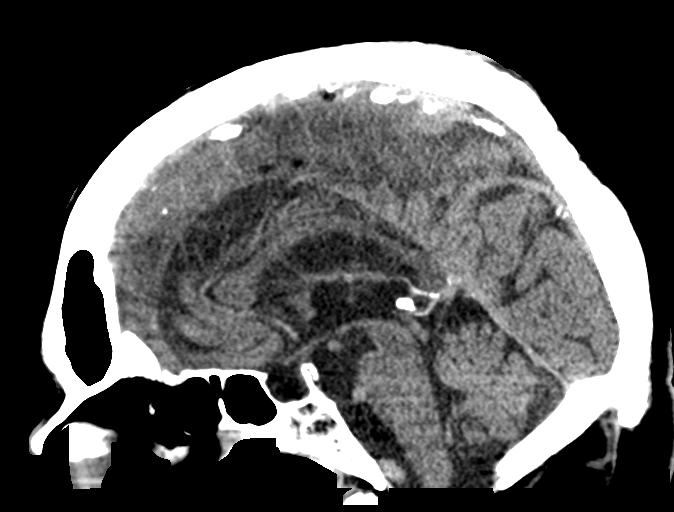
[im 41/61  brain]
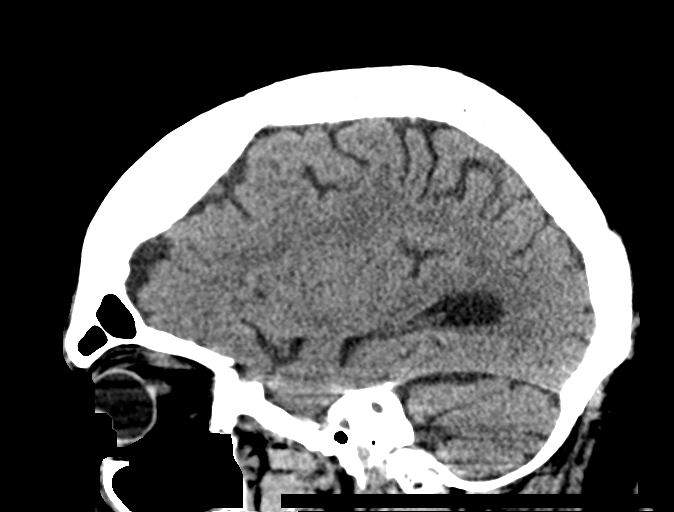

[15 of 47 positions shown; findings below may reference images not displayed]

FINDINGS: CT HEAD FINDINGS

Brain: The ventricles and sulci appropriate size for patient's age.
The gray-white matter discrimination is preserved. There is no acute
intracranial hemorrhage. No mass effect or midline shift. No
extra-axial fluid collection.

Vascular: No hyperdense vessel or unexpected calcification.

Skull: Normal. Negative for fracture or focal lesion.

Sinuses/Orbits: Partial opacification of the left maxillary sinus
with air-fluid level. The remainder of the visualized paranasal
sinuses and the mastoid air cells are clear.

Other: None

CT CERVICAL SPINE FINDINGS

Alignment: No acute subluxation.

Skull base and vertebrae: No acute fracture.

Soft tissues and spinal canal: No prevertebral fluid or swelling. No
visible canal hematoma.

Disc levels:  Degenerative changes.

Upper chest: Negative.

Other: None
IMPRESSION: 1. No acute intracranial pathology.
2. No acute/traumatic cervical spine pathology.

## 2022-09-24 ENCOUNTER — Encounter (INDEPENDENT_AMBULATORY_CARE_PROVIDER_SITE_OTHER): Payer: Self-pay

## 2023-04-09 ENCOUNTER — Other Ambulatory Visit: Payer: Self-pay | Admitting: Acute Care

## 2023-04-09 DIAGNOSIS — Z87891 Personal history of nicotine dependence: Secondary | ICD-10-CM

## 2023-05-19 ENCOUNTER — Encounter: Payer: Self-pay | Admitting: Acute Care

## 2023-05-26 ENCOUNTER — Other Ambulatory Visit: Payer: BC Managed Care – PPO

## 2023-05-30 ENCOUNTER — Ambulatory Visit
Admission: RE | Admit: 2023-05-30 | Discharge: 2023-05-30 | Disposition: A | Discharge status: Home / Self Care | Payer: Medicaid Other | Source: Ambulatory Visit | Attending: Acute Care | Admitting: Acute Care

## 2023-05-30 DIAGNOSIS — Z87891 Personal history of nicotine dependence: Secondary | ICD-10-CM

## 2023-06-16 ENCOUNTER — Telehealth: Payer: Self-pay | Admitting: *Deleted

## 2023-06-16 DIAGNOSIS — R911 Solitary pulmonary nodule: Secondary | ICD-10-CM

## 2023-06-16 DIAGNOSIS — Z87891 Personal history of nicotine dependence: Secondary | ICD-10-CM

## 2023-06-16 NOTE — Telephone Encounter (Signed)
 Spoke with Wilma, pt's mother and DPR and advised of lung screening CT results. Per Dr Jayme Cloud we will need to repeat his scan in 3 months due to motion during the scan. She verbalized understanding. Results/ plans faxed to PCP. Order placed for 3 month nodule f/u CT.

## 2023-07-14 ENCOUNTER — Other Ambulatory Visit (INDEPENDENT_AMBULATORY_CARE_PROVIDER_SITE_OTHER): Payer: Self-pay | Admitting: Nurse Practitioner

## 2023-07-14 DIAGNOSIS — I7121 Aneurysm of the ascending aorta, without rupture: Secondary | ICD-10-CM

## 2023-07-14 DIAGNOSIS — I714 Abdominal aortic aneurysm, without rupture, unspecified: Secondary | ICD-10-CM

## 2023-07-18 ENCOUNTER — Ambulatory Visit (INDEPENDENT_AMBULATORY_CARE_PROVIDER_SITE_OTHER): Payer: BC Managed Care – PPO | Admitting: Nurse Practitioner

## 2023-07-18 ENCOUNTER — Encounter (INDEPENDENT_AMBULATORY_CARE_PROVIDER_SITE_OTHER): Payer: Self-pay | Admitting: Nurse Practitioner

## 2023-07-18 ENCOUNTER — Ambulatory Visit (INDEPENDENT_AMBULATORY_CARE_PROVIDER_SITE_OTHER): Payer: BC Managed Care – PPO

## 2023-07-18 VITALS — BP 95/68 | HR 51 | Resp 16 | Wt 244.6 lb

## 2023-07-18 DIAGNOSIS — I714 Abdominal aortic aneurysm, without rupture, unspecified: Secondary | ICD-10-CM

## 2023-07-18 DIAGNOSIS — I7121 Aneurysm of the ascending aorta, without rupture: Secondary | ICD-10-CM | POA: Diagnosis not present

## 2023-07-18 DIAGNOSIS — N281 Cyst of kidney, acquired: Secondary | ICD-10-CM | POA: Diagnosis not present

## 2023-07-18 DIAGNOSIS — I890 Lymphedema, not elsewhere classified: Secondary | ICD-10-CM

## 2023-07-18 NOTE — Progress Notes (Signed)
 Subjective:    Patient ID: David Lynn, male    DOB: [birth year], 63 y.o.   MRN: [id number] Chief Complaint  Patient presents with   Follow-up    69yr AAA follow up    The patient returns to the office for evaluation of a known thoracic aortic aneurysm. Patient denies abdominal pain or back pain, no other abdominal complaints. No changes suggesting embolic episodes.    He has a known thoracic aortic aneurysm which is followed by his annual CT scan done for his lungs.  His latest CT scan done on 05/30/2023, does not mention this thoracic aortic aneurysm in the report.  However independent review showed a stable 4.5 cm thoracic aortic aneurysm consistent with his previous studies.   There have been no interval changes in the patient's overall health care since his last visit.   Patient denies amaurosis fugax or TIA symptoms. There is no history of claudication or rest pain symptoms of the lower extremities. The patient denies angina or shortness of breath.    Duplex US of the aorta and iliac arteries shows an aorta that measures 2.54 cm. No Aneurysm identified.     Review of Systems  Cardiovascular:  Negative for leg swelling.  All other systems reviewed and are negative.      Objective:   Physical Exam Vitals reviewed.  HENT:     Head: Normocephalic.  Cardiovascular:     Rate and Rhythm: Normal rate.     Pulses: Normal pulses.  Pulmonary:     Effort: Pulmonary effort is normal.  Musculoskeletal:     Right lower leg: No edema.     Left lower leg: No edema.  Skin:    General: Skin is warm and dry.  Neurological:     Mental Status: He is alert and oriented to person, place, and time.  Psychiatric:        Mood and Affect: Mood normal.        Behavior: Behavior normal.        Thought Content: Thought content normal.        Judgment: Judgment normal.     BP 95/68   Pulse (!) 51   Resp 16   Wt 244 lb 9.6 oz (110.9 kg)   BMI 34.11 kg/m   Past Medical History:   Diagnosis Date   Epilepsy (HCC)    Seizure disorder (HCC)     Social History   Socioeconomic History   Marital status: Single    Spouse name: Not on file   Number of children: Not on file   Years of education: Not on file   Highest education level: Not on file  Occupational History   Not on file  Tobacco Use   Smoking status: Former    Current packs/day: 0.00    Average packs/day: 1 pack/day for 44.0 years (44.0 ttl pk-yrs)    Types: Cigarettes    Start date: 05/01/1976    Quit date: 05/01/2020    Years since quitting: 3.2   Smokeless tobacco: Never  Vaping Use   Vaping status: Never Used  Substance and Sexual Activity   Alcohol use: No   Drug use: No   Sexual activity: Not on file  Other Topics Concern   Not on file  Social History Narrative   Not on file   Social Drivers of Health   Financial Resource Strain: Patient Declined (05/09/2022)   Received from [facility], [facility]  Overall Financial Resource Strain (CARDIA)    Difficulty of Paying Living Expenses: Patient declined  Food Insecurity: Patient Declined (05/09/2022)   Received from [facility], [facility]   Hunger Vital Sign    Worried About Running Out of Food in the Last Year: Patient declined    Ran Out of Food in the Last Year: Patient declined  Transportation Needs: Patient Declined (05/09/2022)   Received from [facility], [facility]   [facility] - Transportation    In the past 12 months, has lack of transportation kept you from medical appointments or from getting medications?: Patient declined    Lack of Transportation (Non-Medical): Patient declined  Physical Activity: Not on file  Stress: Not on file  Social Connections: Not on file  Intimate Partner Violence: Not on file    Past Surgical History:  Procedure Laterality Date   COLONOSCOPY WITH PROPOFOL N/A 01/02/2018   Procedure:  COLONOSCOPY WITH PROPOFOL;  Surgeon: Toledo, Boykin Nearing, MD;  Location: [facility] ENDOSCOPY;  Service: Gastroenterology;  Laterality: N/A;   HYDROCELE EXCISION      Family History  Problem Relation Age of Onset   Heart attack Father     Allergies  Allergen Reactions   Iodine Rash   Penicillins Rash       Latest Ref Rng & Units 02/12/2021    8:41 PM 01/10/2018    3:55 PM 01/02/2017    1:09 PM  CBC  WBC 4.0 - 10.5 K/uL 6.0  7.3  5.4   Hemoglobin 13.0 - 17.0 g/dL 57.8  46.9  62.9   Hematocrit 39.0 - 52.0 % 40.7  42.9  42.2   Platelets 150 - 400 K/uL 286  317  279       CMP     Component Value Date/Time   NA 140 02/12/2021 2041   NA 137 05/14/2013 1737   K 4.0 02/12/2021 2041   K 4.3 05/14/2013 1737   CL 107 02/12/2021 2041   CL 105 05/14/2013 1737   CO2 25 02/12/2021 2041   CO2 24 05/14/2013 1737   GLUCOSE 108 (H) 02/12/2021 2041   GLUCOSE 115 (H) 05/14/2013 1737   BUN 15 02/12/2021 2041   BUN 15 05/14/2013 1737   CREATININE 0.98 02/12/2021 2041   CREATININE 0.96 05/14/2013 1737   CALCIUM 9.9 02/12/2021 2041   CALCIUM 8.1 (L) 05/14/2013 1737   PROT 7.4 02/12/2021 2041   ALBUMIN 4.0 02/12/2021 2041   AST 36 02/12/2021 2041   ALT 33 02/12/2021 2041   ALKPHOS 190 (H) 02/12/2021 2041   BILITOT 0.6 02/12/2021 2041   GFRNONAA >60 02/12/2021 2041   GFRNONAA >60 05/14/2013 1737     No results found.     Assessment & Plan:   1. Aneurysm of ascending aorta without rupture (HCC) (Primary) The patient underwent a lung screening CT scan on 05/30/2023.  These have been annual for him, to screen due to his history of smoking.  Unfortunately, the report does not mention his thoracic abdominal aneurysm.  I have independently reviewed and measured at approximately 4.5 cm with no significant growth from his previous studies done on 05/24/2022.  However the patient's mother is concerned because she was told that the imaging is poor quality and would like to have this repeated.  Based  on that we will have the patient repeat CT scan for evaluation. - CT CHEST WO CONTRAST; Future  2. Abdominal aortic aneurysm (AAA) without rupture, unspecified  part [facility]) I had a very long discussion with the patient and his mother today regarding abdominal aortic aneurysms.  Since his first study in 2022 the patient has never measured greater than 2.6 cm.  Technical criteria for an ectatic abdominal aorta is 3 cm.  Based on this the patient does not have an abdominal aorta.  Typically in these instances we would not continue to follow these on an annual basis.  3. Lymphedema The patient's lymphedema is doing very well.  He is very diligent with use of his medical grade compression socks as well as sporadic use of his lymphedema pump.  Given his good control this can be followed annually.  In addition the patient does have notable varicosities and patient is advised that if these ever become bothersome for him to let us know we can always evaluate these.  4. Renal cyst The previously mentioned CT lung, also noted a renal cyst.  We will send the patient to nephrology for evaluation. - Ambulatory referral to Nephrology   Current Outpatient Medications on File Prior to Visit  Medication Sig Dispense Refill   lamoTRIgine (LAMICTAL) 200 MG tablet Take 500 mg by mouth daily.     levETIRAcetam (KEPPRA) 750 MG tablet Take 750 mg by mouth 2 (two) times daily.     mupirocin ointment (BACTROBAN) 2 % Place 1 application into the nose as needed. (Patient not taking: Reported on 07/18/2022)     mupirocin ointment (BACTROBAN) 2 % Apply 1 application topically daily. With dressing changes (Patient not taking: Reported on 07/18/2022) 22 g 0   No current facility-administered medications on file prior to visit.    There are no Patient Instructions on file for this visit. No follow-ups on file.   Georgiana Spinner, NP

## 2023-07-27 ENCOUNTER — Telehealth (INDEPENDENT_AMBULATORY_CARE_PROVIDER_SITE_OTHER): Payer: Self-pay | Admitting: Nurse Practitioner

## 2023-07-27 NOTE — Telephone Encounter (Signed)
 Received call from pt mother Wilma RE:CT scan FB ordered. Stated ins was not authorizing CT and it was scheduled at 8:30am in the morning. Advised that medical documentation has been submitted to ins and we are just waiting for ins to get back to us . Advised once we get approval we will call her to get pt scheduled for CT scan. Amos Balint stated she did not know how patient got scheduled for CT and that she did not make it. Will contact ins and cancel CT for AM since it is not authorized.

## 2023-07-28 ENCOUNTER — Ambulatory Visit

## 2023-07-28 ENCOUNTER — Other Ambulatory Visit

## 2023-08-03 NOTE — Telephone Encounter (Signed)
 Placing another referral wouldn't be helpful because insurance would still need to approve it.  We can appeal to the insurance but that is not guarantee they will approve it.  I suspect it is because he had a recent CT scan and they may note that it is too soon to repeat.  Alternatively if insurance will not approve they can pay out of pocket for it

## 2023-08-03 NOTE — Telephone Encounter (Signed)
 David Lynn called stating that radiology will not let her make and appt because David Lynn was not approved. She would like to know what the next steps are to getting his appt approved by his insurance or another referral placed because she is concerned because of the aneurysm. She would like stated this is a serious matter.   Please advise

## 2023-08-03 NOTE — Telephone Encounter (Signed)
 Patient stated that she will call the insurance and see if can see what's going one and she what she can do.

## 2023-08-04 ENCOUNTER — Telehealth (INDEPENDENT_AMBULATORY_CARE_PROVIDER_SITE_OTHER): Payer: Self-pay

## 2023-08-04 NOTE — Telephone Encounter (Signed)
 ERROR

## 2023-08-04 NOTE — Telephone Encounter (Signed)
 Patient is scheduled with Dr. Prescilla Brod on 5.8.25 at 3:45. I spoke with mother and she states understanding of appt is for CT results.

## 2023-08-04 NOTE — Telephone Encounter (Signed)
 This is the first that I was notified that she requested a phone call from me.   As far as the CT scan (not xray). I didn't tell her it was unreadable, her PCP suggested that it was not a good study.  We can offer her a visit to see Dr. Prescilla Brod and he can review the CT with her and her son directly so that she can get his view directly.

## 2023-08-11 ENCOUNTER — Other Ambulatory Visit

## 2023-08-16 ENCOUNTER — Encounter: Payer: Self-pay | Admitting: Acute Care

## 2023-08-17 ENCOUNTER — Encounter (INDEPENDENT_AMBULATORY_CARE_PROVIDER_SITE_OTHER): Payer: Self-pay | Admitting: Vascular Surgery

## 2023-08-17 ENCOUNTER — Ambulatory Visit (INDEPENDENT_AMBULATORY_CARE_PROVIDER_SITE_OTHER): Admitting: Vascular Surgery

## 2023-08-17 VITALS — BP 124/71 | HR 71 | Resp 16 | Wt 248.0 lb

## 2023-08-17 DIAGNOSIS — I7121 Aneurysm of the ascending aorta, without rupture: Secondary | ICD-10-CM | POA: Diagnosis not present

## 2023-08-17 DIAGNOSIS — I890 Lymphedema, not elsewhere classified: Secondary | ICD-10-CM

## 2023-08-17 DIAGNOSIS — I872 Venous insufficiency (chronic) (peripheral): Secondary | ICD-10-CM

## 2023-08-17 NOTE — Progress Notes (Signed)
 MRN : [id number]  David Lynn is a 63 y.o. ([birth year]) male who presents with chief complaint of check circulation.  History of Present Illness:   The patient presents to the office for evaluation of an ascending thoracic aortic aneurysm. The aneurysm was found incidentally by CT scan. Patient denies chest pain or unusual back pain, no other chest or abdominal complaints.  No history of an abrupt onset of a painful toe associated with blue discoloration.     No family history of TAA/AAA.   Patient denies amaurosis fugax or TIA symptoms.  There is no history of claudication or rest pain symptoms of the lower extremities.   The patient denies angina or shortness of breath.  CT scan dated 05/30/2023, shows an ascending TAA that measures 5.3 cm   Current Meds  Medication Sig   lamoTRIgine  (LAMICTAL ) 200 MG tablet Take 500 mg by mouth daily.   levETIRAcetam  (KEPPRA ) 750 MG tablet Take 750 mg by mouth 2 (two) times daily.    Past Medical History:  Diagnosis Date   Epilepsy (HCC)    Seizure disorder [facility])     Past Surgical History:  Procedure Laterality Date   COLONOSCOPY WITH PROPOFOL  N/A 01/02/2018   Procedure: COLONOSCOPY WITH PROPOFOL ;  Surgeon: Toledo, Alphonsus Jeans, MD;  Location: [facility] ENDOSCOPY;  Service: Gastroenterology;  Laterality: N/A;   HYDROCELE EXCISION      Social History Social History   Tobacco Use   Smoking status: Former    Current packs/day: 0.00    Average packs/day: 1 pack/day for 44.0 years (44.0 ttl pk-yrs)    Types: Cigarettes    Start date: 05/01/1976    Quit date: 05/01/2020    Years since quitting: 3.2   Smokeless tobacco: Never  Vaping Use   Vaping status: Never Used  Substance Use Topics   Alcohol use: No   Drug use: No    Family History Family History  Problem Relation Age of Onset   Heart attack Father     Allergies  Allergen Reactions   Iodine Rash    Penicillins Rash     REVIEW OF SYSTEMS (Negative unless checked)  Constitutional: [] Weight loss  [] Fever  [] Chills Cardiac: [] Chest pain   [] Chest pressure   [] Palpitations   [] Shortness of breath when laying flat   [] Shortness of breath with exertion. Vascular:  [x] Pain in legs with walking   [] Pain in legs at rest  [] History of DVT   [] Phlebitis   [] Swelling in legs   [] Varicose veins   [] Non-healing ulcers Pulmonary:   [] Uses home oxygen   [] Productive cough   [] Hemoptysis   [] Wheeze  [] COPD   [] Asthma Neurologic:  [] Dizziness   [] Seizures   [] History of stroke   [] History of TIA  [] Aphasia   [] Vissual changes   [] Weakness or numbness in arm   [] Weakness or numbness in leg Musculoskeletal:   [] Joint swelling   [] Joint pain   [] Low back pain Hematologic:  [] Easy bruising  [] Easy bleeding   [] Hypercoagulable state   [] Anemic Gastrointestinal:  [] Diarrhea   [] Vomiting  [] Gastroesophageal reflux/heartburn   []   Difficulty swallowing. Genitourinary:  [] Chronic kidney disease   [] Difficult urination  [] Frequent urination   [] Blood in urine Skin:  [] Rashes   [] Ulcers  Psychological:  [] History of anxiety   []  History of major depression.  Physical Examination  Vitals:   08/17/23 1530  BP: 124/71  Pulse: 71  Resp: 16  Weight: 248 lb (112.5 kg)   Body mass index is 34.59 kg/m. Gen: WD/WN, NAD Head: [place]/AT, No temporalis wasting.  Ear/Nose/Throat: Hearing grossly intact, nares w/o erythema or drainage Eyes: PER, EOMI, sclera nonicteric.  Neck: Supple, no masses.  No bruit or JVD.  Pulmonary:  Good air movement, no audible wheezing, no use of accessory muscles.  Cardiac: RRR, normal S1, S2, no Murmurs. Vascular:  mild trophic changes, no open wounds Vessel Right Left  Radial Palpable Palpable  Gastrointestinal: soft, non-distended. No guarding/no peritoneal signs.  Musculoskeletal: M/S 5/5 throughout.  No visible deformity.  Neurologic: CN 2-12 intact. Pain and light touch intact in  extremities.  Symmetrical.  Speech is fluent. Motor exam as listed above. Psychiatric: Judgment intact, Mood & affect appropriate for pt's clinical situation. Dermatologic: No rashes or ulcers noted.  No changes consistent with cellulitis.   CBC Lab Results  Component Value Date   WBC 6.0 02/12/2021   HGB 13.4 02/12/2021   HCT 40.7 02/12/2021   MCV 91.1 02/12/2021   PLT 286 02/12/2021    BMET    Component Value Date/Time   NA 140 02/12/2021 2041   NA 137 05/14/2013 1737   K 4.0 02/12/2021 2041   K 4.3 05/14/2013 1737   CL 107 02/12/2021 2041   CL 105 05/14/2013 1737   CO2 25 02/12/2021 2041   CO2 24 05/14/2013 1737   GLUCOSE 108 (H) 02/12/2021 2041   GLUCOSE 115 (H) 05/14/2013 1737   BUN 15 02/12/2021 2041   BUN 15 05/14/2013 1737   CREATININE 0.98 02/12/2021 2041   CREATININE 0.96 05/14/2013 1737   CALCIUM 9.9 02/12/2021 2041   CALCIUM 8.1 (L) 05/14/2013 1737   GFRNONAA >60 02/12/2021 2041   GFRNONAA >60 05/14/2013 1737   GFRAA >60 01/10/2018 1555   GFRAA >60 05/14/2013 1737   CrCl cannot be calculated (Patient's most recent lab result is older than the maximum 21 days allowed.).  COAG No results found for: "INR", "PROTIME"  Radiology No results found.   Assessment/Plan 1. Aneurysm of ascending aorta without rupture (HCC) (Primary) Recommend:  No surgery or intervention is indicated at this time.  The patient has an asymptomatic thoracic aortic aneurysm that is less than 6.0 cm in maximal diameter.  I have discussed the natural history of thoracic aortic aneurysm and the small risk of rupture for aneurysm less than 6.5 cm in size.  However, as these small aneurysms tend to enlarge over time, continued surveillance with CT scan is mandatory.   I have also discussed optimizing medical management with hypertension and lipid control and the negative effect that any tobacco products have on aneurysmal disease.  The patient is also encouraged to exercise a minimum  of 30 minutes 4 times a week.   Should the patient develop new onset chest or back pain or signs of peripheral embolization they are instructed to seek medical attention immediately and to alert the physician providing care that they have an aneurysm in the chest.   The patient voices their understanding.  The patient will return as ordered with a CT scan of the chest  2. Chronic venous insufficiency No surgery or  intervention at this point in time.   The patient is CEAP [id number]   I have discussed with the patient venous insufficiency and why it  causes symptoms. I have discussed with the patient the chronic skin changes that accompany venous insufficiency and the long term sequela such as infection and ulceration.  Patient will begin wearing graduated compression stockings or compression wraps on a daily basis.  The patient will put the compression on first thing in the morning and removing them in the evening. The patient is instructed specifically not to sleep in the compression.    In addition, behavioral modification including several periods of elevation of the lower extremities during the day will be continued. I have demonstrated that proper elevation is a position with the ankles at heart level.  The patient is instructed to begin routine exercise, especially walking on a daily basis  The patient will be assessed for a Lymph Pump depending on the effectiveness of conservative therapy and the control of the associated lymphedema.  3. Lymphedema No surgery or intervention at this point in time.   The patient is CEAP [id number]   I have discussed with the patient venous insufficiency and why it  causes symptoms. I have discussed with the patient the chronic skin changes that accompany venous insufficiency and the long term sequela such as infection and ulceration.  Patient will begin wearing graduated compression stockings or compression wraps on a daily basis.  The patient will put  the compression on first thing in the morning and removing them in the evening. The patient is instructed specifically not to sleep in the compression.    In addition, behavioral modification including several periods of elevation of the lower extremities during the day will be continued. I have demonstrated that proper elevation is a position with the ankles at heart level.  The patient is instructed to begin routine exercise, especially walking on a daily basis  The patient will be assessed for a Lymph Pump depending on the effectiveness of conservative therapy and the control of the associated lymphedema.    Devon Fogo, MD  08/17/2023 3:32 PM

## 2023-08-20 ENCOUNTER — Encounter (INDEPENDENT_AMBULATORY_CARE_PROVIDER_SITE_OTHER): Payer: Self-pay | Admitting: Vascular Surgery

## 2023-08-29 ENCOUNTER — Encounter (INDEPENDENT_AMBULATORY_CARE_PROVIDER_SITE_OTHER): Payer: Self-pay

## 2023-10-05 ENCOUNTER — Other Ambulatory Visit

## 2023-10-10 ENCOUNTER — Ambulatory Visit
Admission: RE | Admit: 2023-10-10 | Discharge: 2023-10-10 | Disposition: A | Discharge status: Home / Self Care | Source: Ambulatory Visit | Attending: Acute Care | Admitting: Acute Care

## 2023-10-10 DIAGNOSIS — Z87891 Personal history of nicotine dependence: Secondary | ICD-10-CM

## 2023-10-10 DIAGNOSIS — R911 Solitary pulmonary nodule: Secondary | ICD-10-CM

## 2023-10-19 ENCOUNTER — Other Ambulatory Visit: Payer: Self-pay

## 2023-10-19 DIAGNOSIS — Z87891 Personal history of nicotine dependence: Secondary | ICD-10-CM

## 2023-10-19 DIAGNOSIS — R911 Solitary pulmonary nodule: Secondary | ICD-10-CM

## 2023-10-19 DIAGNOSIS — Z122 Encounter for screening for malignant neoplasm of respiratory organs: Secondary | ICD-10-CM

## 2024-08-15 ENCOUNTER — Ambulatory Visit (INDEPENDENT_AMBULATORY_CARE_PROVIDER_SITE_OTHER): Admitting: Vascular Surgery
